# Patient Record
Sex: Male | Born: 1955 | Race: Black or African American | Hispanic: No | Marital: Single | Smoking: Current every day smoker
Health system: Southern US, Community
[De-identification: ages and names within clinical notes are randomized; demographics above are authoritative.]

## PROBLEM LIST (undated history)

## (undated) DIAGNOSIS — F419 Anxiety disorder, unspecified: Secondary | ICD-10-CM

## (undated) DIAGNOSIS — I219 Acute myocardial infarction, unspecified: Secondary | ICD-10-CM

## (undated) DIAGNOSIS — M1990 Unspecified osteoarthritis, unspecified site: Secondary | ICD-10-CM

## (undated) DIAGNOSIS — I10 Essential (primary) hypertension: Secondary | ICD-10-CM

## (undated) DIAGNOSIS — I639 Cerebral infarction, unspecified: Secondary | ICD-10-CM

## (undated) HISTORY — PX: CORONARY STENT PLACEMENT: SHX1402

---

## 2004-12-17 ENCOUNTER — Ambulatory Visit: Payer: Self-pay | Admitting: Family Medicine

## 2005-01-22 ENCOUNTER — Ambulatory Visit: Payer: Self-pay | Admitting: Family Medicine

## 2005-03-10 ENCOUNTER — Ambulatory Visit: Payer: Self-pay | Admitting: Nurse Practitioner

## 2005-03-11 ENCOUNTER — Ambulatory Visit: Payer: Self-pay | Admitting: *Deleted

## 2006-11-16 ENCOUNTER — Emergency Department (HOSPITAL_COMMUNITY): Admission: EM | Admit: 2006-11-16 | Discharge: 2006-11-16 | Payer: Self-pay | Admitting: Emergency Medicine

## 2007-02-18 ENCOUNTER — Ambulatory Visit: Payer: Self-pay | Admitting: Family Medicine

## 2007-03-11 ENCOUNTER — Ambulatory Visit: Payer: Self-pay | Admitting: Family Medicine

## 2007-03-11 LAB — CONVERTED CEMR LAB
AST: 21 units/L (ref 0–37)
Alkaline Phosphatase: 96 units/L (ref 39–117)
BUN: 11 mg/dL (ref 6–23)
Basophils Relative: 0 % (ref 0–1)
Calcium: 10.1 mg/dL (ref 8.4–10.5)
Creatinine, Ser: 0.86 mg/dL (ref 0.40–1.50)
Eosinophils Absolute: 0.1 10*3/uL (ref 0.0–0.7)
HDL: 31 mg/dL — ABNORMAL LOW (ref >39)
Hemoglobin: 15.3 g/dL (ref 13.0–17.0)
MCHC: 34 g/dL (ref 30.0–36.0)
MCV: 84.3 fL (ref 78.0–100.0)
Monocytes Absolute: 0.3 10*3/uL (ref 0.1–1.0)
Monocytes Relative: 6 % (ref 3–12)
RBC: 5.34 M/uL (ref 4.22–5.81)
Testosterone: 332.79 ng/dL — ABNORMAL LOW (ref 350–890)
Total CHOL/HDL Ratio: 7.6

## 2007-03-12 ENCOUNTER — Ambulatory Visit (HOSPITAL_COMMUNITY): Admission: RE | Admit: 2007-03-12 | Discharge: 2007-03-12 | Payer: Self-pay | Admitting: Family Medicine

## 2007-03-26 ENCOUNTER — Ambulatory Visit: Payer: Self-pay | Admitting: Family Medicine

## 2007-05-27 ENCOUNTER — Ambulatory Visit: Payer: Self-pay | Admitting: Internal Medicine

## 2007-05-28 ENCOUNTER — Ambulatory Visit (HOSPITAL_COMMUNITY): Admission: RE | Admit: 2007-05-28 | Discharge: 2007-05-28 | Payer: Self-pay | Admitting: Family Medicine

## 2007-11-12 ENCOUNTER — Ambulatory Visit: Payer: Self-pay | Admitting: Family Medicine

## 2007-11-12 LAB — CONVERTED CEMR LAB
BUN: 16 mg/dL (ref 6–23)
CO2: 22 meq/L (ref 19–32)
Cholesterol: 271 mg/dL — ABNORMAL HIGH (ref 0–200)
Glucose, Bld: 90 mg/dL (ref 70–99)
Potassium: 4.2 meq/L (ref 3.5–5.3)
VLDL: 43 mg/dL — ABNORMAL HIGH (ref 0–40)

## 2007-11-18 ENCOUNTER — Ambulatory Visit: Payer: Self-pay | Admitting: Internal Medicine

## 2007-12-29 ENCOUNTER — Ambulatory Visit: Payer: Self-pay | Admitting: Family Medicine

## 2008-02-25 ENCOUNTER — Ambulatory Visit: Payer: Self-pay | Admitting: Family Medicine

## 2008-02-25 LAB — CONVERTED CEMR LAB
Cholesterol: 222 mg/dL — ABNORMAL HIGH (ref 0–200)
HDL: 37 mg/dL — ABNORMAL LOW (ref >39)
LDL Cholesterol: 153 mg/dL — ABNORMAL HIGH (ref 0–99)
Triglycerides: 160 mg/dL — ABNORMAL HIGH (ref <150)

## 2008-05-23 ENCOUNTER — Ambulatory Visit: Payer: Self-pay | Admitting: Family Medicine

## 2008-09-15 ENCOUNTER — Emergency Department (HOSPITAL_COMMUNITY): Admission: EM | Admit: 2008-09-15 | Discharge: 2008-09-15 | Payer: Self-pay | Admitting: Emergency Medicine

## 2009-01-10 ENCOUNTER — Encounter (INDEPENDENT_AMBULATORY_CARE_PROVIDER_SITE_OTHER): Payer: Self-pay | Admitting: Family Medicine

## 2009-01-10 ENCOUNTER — Ambulatory Visit: Payer: Self-pay | Admitting: Internal Medicine

## 2009-01-10 LAB — CONVERTED CEMR LAB
Basophils Relative: 0 % (ref 0–1)
CO2: 19 meq/L (ref 19–32)
Calcium: 10.1 mg/dL (ref 8.4–10.5)
Chloride: 102 meq/L (ref 96–112)
Cholesterol: 283 mg/dL — ABNORMAL HIGH (ref 0–200)
Creatinine, Ser: 1.36 mg/dL (ref 0.40–1.50)
Eosinophils Absolute: 0.1 10*3/uL (ref 0.0–0.7)
Eosinophils Relative: 1 % (ref 0–5)
Glucose, Bld: 122 mg/dL — ABNORMAL HIGH (ref 70–99)
HCT: 45 % (ref 39.0–52.0)
Hemoglobin: 15.6 g/dL (ref 13.0–17.0)
MCHC: 34.7 g/dL (ref 30.0–36.0)
MCV: 87.9 fL (ref 78.0–100.0)
Monocytes Absolute: 0.6 10*3/uL (ref 0.1–1.0)
Monocytes Relative: 8 % (ref 3–12)
Neutrophils Relative %: 52 % (ref 43–77)
RBC: 5.12 M/uL (ref 4.22–5.81)
Total Bilirubin: 0.4 mg/dL (ref 0.3–1.2)
Total CHOL/HDL Ratio: 9.4
Triglycerides: 515 mg/dL — ABNORMAL HIGH (ref <150)

## 2010-02-19 ENCOUNTER — Other Ambulatory Visit (HOSPITAL_COMMUNITY): Payer: Self-pay | Admitting: "Endocrinology

## 2010-02-19 ENCOUNTER — Other Ambulatory Visit (HOSPITAL_COMMUNITY): Payer: Self-pay | Admitting: Internal Medicine

## 2010-02-19 DIAGNOSIS — M25569 Pain in unspecified knee: Secondary | ICD-10-CM

## 2010-02-19 DIAGNOSIS — G8929 Other chronic pain: Secondary | ICD-10-CM

## 2010-02-20 ENCOUNTER — Other Ambulatory Visit (HOSPITAL_COMMUNITY): Payer: Self-pay

## 2010-03-05 ENCOUNTER — Other Ambulatory Visit (HOSPITAL_COMMUNITY): Payer: Self-pay | Admitting: "Endocrinology

## 2010-03-05 ENCOUNTER — Ambulatory Visit (HOSPITAL_COMMUNITY)
Admission: RE | Admit: 2010-03-05 | Discharge: 2010-03-05 | Disposition: A | Discharge status: Home / Self Care | Payer: Medicaid Other | Source: Ambulatory Visit | Attending: Internal Medicine | Admitting: Internal Medicine

## 2010-03-05 DIAGNOSIS — IMO0002 Reserved for concepts with insufficient information to code with codable children: Secondary | ICD-10-CM | POA: Insufficient documentation

## 2010-03-05 DIAGNOSIS — M25569 Pain in unspecified knee: Secondary | ICD-10-CM | POA: Insufficient documentation

## 2010-03-05 DIAGNOSIS — M1710 Unilateral primary osteoarthritis, unspecified knee: Secondary | ICD-10-CM | POA: Insufficient documentation

## 2010-03-05 DIAGNOSIS — M25469 Effusion, unspecified knee: Secondary | ICD-10-CM | POA: Insufficient documentation

## 2010-07-18 ENCOUNTER — Telehealth: Payer: Self-pay

## 2010-09-03 NOTE — Telephone Encounter (Signed)
Called, customer not available. Will mail a letter for pt to call.

## 2010-09-25 ENCOUNTER — Telehealth: Payer: Self-pay

## 2010-09-25 NOTE — Telephone Encounter (Signed)
Called Dr. Letitia Neri office. Letter was returned from pt sent to [address] In [place]. Phone number always gives a recording, wireless customer not available on [phone]. They will make a note and get more info when he comes back in the office, and let us know.

## 2010-10-17 ENCOUNTER — Encounter: Payer: Self-pay | Admitting: *Deleted

## 2010-10-17 ENCOUNTER — Emergency Department (HOSPITAL_COMMUNITY)
Admission: EM | Admit: 2010-10-17 | Discharge: 2010-10-17 | Disposition: A | Discharge status: Home / Self Care | Payer: Medicare Other | Attending: Emergency Medicine | Admitting: Emergency Medicine

## 2010-10-17 DIAGNOSIS — M542 Cervicalgia: Secondary | ICD-10-CM | POA: Insufficient documentation

## 2010-10-17 DIAGNOSIS — M129 Arthropathy, unspecified: Secondary | ICD-10-CM | POA: Insufficient documentation

## 2010-10-17 DIAGNOSIS — M1990 Unspecified osteoarthritis, unspecified site: Secondary | ICD-10-CM

## 2010-10-17 DIAGNOSIS — F17200 Nicotine dependence, unspecified, uncomplicated: Secondary | ICD-10-CM | POA: Insufficient documentation

## 2010-10-17 HISTORY — DX: Essential (primary) hypertension: I10

## 2010-10-17 MED ORDER — TRAMADOL HCL 50 MG PO TABS: 50.0000 mg | ORAL_TABLET | Freq: Four times a day (QID) | ORAL | Status: AC | PRN

## 2010-10-17 MED ORDER — KETOROLAC TROMETHAMINE 60 MG/2ML IM SOLN
60.0000 mg | Freq: Once | INTRAMUSCULAR | Status: AC
  Administered 2010-10-17: 60 mg via INTRAMUSCULAR
  Filled 2010-10-17: qty 2

## 2010-10-17 NOTE — ED Provider Notes (Signed)
History     CSN: [id number] Arrival date & time: 10/17/2010  1:35 AM  Chief Complaint  Patient presents with  . 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 He states that this comes on intermittently, is moderate at this time, is worsened by changes in the weather and does not associated with fevers, vomiting, redness, swelling. He has taken no medications prior to arrival. He has been told that he needs to have both of his knees replaced.  Patient is a 55 y.o. male presenting with neck pain. The history is provided by the patient and medical records.  Neck Pain     Past Medical History  Diagnosis Date  . Hypertension   . Diabetes mellitus     History reviewed. No pertinent past surgical history.  History reviewed. No pertinent family history.  History  Substance Use Topics  . Smoking status: Current Everyday Smoker -- 0.5 packs/day    Types: Cigarettes  . Smokeless tobacco: Not on file  . Alcohol Use: Yes     occ. use      Review of Systems  Constitutional: Negative for fever and chills.  HENT: Positive for neck pain.   Respiratory: Negative for cough.   Gastrointestinal: Negative for nausea and vomiting.  Musculoskeletal: Negative for joint swelling.  Skin: Negative for rash.    Allergies  Review of patient's allergies indicates no known allergies.  Home Medications  No current outpatient prescriptions on file.  BP 139/101  Pulse 94  Temp(Src) 97.9 F (36.6 C) (Oral)  Resp 20  Ht 6\' 1"  (1.854 m)  Wt 250 lb (113.399 kg)  BMI 32.98 kg/m2  SpO2 99%  Physical Exam  Nursing note and vitals reviewed. Constitutional: He appears well-developed and well-nourished. No distress.  HENT:  Head:  Normocephalic and atraumatic.  Mouth/Throat: Oropharynx is clear and moist. No oropharyngeal exudate.  Eyes: Conjunctivae and EOM are normal. Pupils are equal, round, and reactive to light. Right eye exhibits no discharge. Left eye exhibits no discharge. No scleral icterus.  Neck: No JVD present. No thyromegaly present.       Patient is able to move his head through a full range of motion but has some pain with rotation.  Cardiovascular: Normal rate, regular rhythm, normal heart sounds and intact distal pulses.  Exam reveals no gallop and no friction rub.   No murmur heard. Pulmonary/Chest: Effort normal and breath sounds normal. No respiratory distress. He has no wheezes. He has no rales.  Abdominal: Soft. Bowel sounds are normal. He exhibits no distension and no mass. There is no tenderness.  Musculoskeletal: He exhibits tenderness. He exhibits no edema.       Bilateral knees with crepitance with range of motion. No clinical effusions present. No other joint arthropathies  Lymphadenopathy:    He has no cervical adenopathy.  Neurological: He is alert. Coordination normal.  Skin: Skin is warm and dry. No rash noted. No erythema.  Psychiatric: He has a normal mood and affect. His behavior is normal.    ED Course  Procedures (including critical care time)  Labs Reviewed - No data to display No results found.   No diagnosis found.    MDM  Chronic arthritis pain present on exam. We'll start on tramadol for home. Toradol given in  the emergency department. Patient states that he has been on oxycodone 10 mg tabs in the past for relief of the symptoms. I have referred him back to his family Dr. for this medicine.        Vida Roller, MD 10/17/10 [phone]

## 2010-10-17 NOTE — ED Notes (Signed)
C/o sharp pains to neck and to feet off and on

## 2010-10-17 NOTE — ED Notes (Signed)
Pt left er stating no needs 

## 2010-10-22 ENCOUNTER — Emergency Department (HOSPITAL_COMMUNITY)
Admission: EM | Admit: 2010-10-22 | Discharge: 2010-10-22 | Disposition: A | Discharge status: Other Acute Inpatient Hospital | Payer: Medicare Other | Source: Home / Self Care | Attending: Emergency Medicine | Admitting: Emergency Medicine

## 2010-10-22 ENCOUNTER — Encounter (HOSPITAL_COMMUNITY): Payer: Self-pay | Admitting: *Deleted

## 2010-10-22 ENCOUNTER — Emergency Department (HOSPITAL_COMMUNITY): Payer: Medicare Other

## 2010-10-22 ENCOUNTER — Inpatient Hospital Stay (HOSPITAL_COMMUNITY)
Admission: AD | Admit: 2010-10-22 | Discharge: 2010-10-30 | DRG: 246 | Disposition: A | Discharge status: Home / Self Care | Payer: Medicare Other | Source: Other Acute Inpatient Hospital | Attending: Internal Medicine | Admitting: Internal Medicine

## 2010-10-22 DIAGNOSIS — R079 Chest pain, unspecified: Secondary | ICD-10-CM | POA: Insufficient documentation

## 2010-10-22 DIAGNOSIS — M069 Rheumatoid arthritis, unspecified: Secondary | ICD-10-CM | POA: Diagnosis present

## 2010-10-22 DIAGNOSIS — Z7901 Long term (current) use of anticoagulants: Secondary | ICD-10-CM

## 2010-10-22 DIAGNOSIS — R55 Syncope and collapse: Secondary | ICD-10-CM

## 2010-10-22 DIAGNOSIS — F17200 Nicotine dependence, unspecified, uncomplicated: Secondary | ICD-10-CM | POA: Diagnosis present

## 2010-10-22 DIAGNOSIS — I200 Unstable angina: Secondary | ICD-10-CM | POA: Diagnosis present

## 2010-10-22 DIAGNOSIS — I10 Essential (primary) hypertension: Secondary | ICD-10-CM | POA: Diagnosis present

## 2010-10-22 DIAGNOSIS — E119 Type 2 diabetes mellitus without complications: Secondary | ICD-10-CM | POA: Diagnosis present

## 2010-10-22 DIAGNOSIS — M1990 Unspecified osteoarthritis, unspecified site: Secondary | ICD-10-CM | POA: Diagnosis present

## 2010-10-22 DIAGNOSIS — I824Z9 Acute embolism and thrombosis of unspecified deep veins of unspecified distal lower extremity: Secondary | ICD-10-CM | POA: Diagnosis not present

## 2010-10-22 DIAGNOSIS — I2510 Atherosclerotic heart disease of native coronary artery without angina pectoris: Principal | ICD-10-CM | POA: Diagnosis present

## 2010-10-22 DIAGNOSIS — E785 Hyperlipidemia, unspecified: Secondary | ICD-10-CM | POA: Diagnosis present

## 2010-10-22 DIAGNOSIS — R0602 Shortness of breath: Secondary | ICD-10-CM | POA: Insufficient documentation

## 2010-10-22 DIAGNOSIS — I2699 Other pulmonary embolism without acute cor pulmonale: Secondary | ICD-10-CM | POA: Diagnosis not present

## 2010-10-22 HISTORY — DX: Unspecified osteoarthritis, unspecified site: M19.90

## 2010-10-22 HISTORY — DX: Anxiety disorder, unspecified: F41.9

## 2010-10-22 LAB — BASIC METABOLIC PANEL
BUN: 9 mg/dL (ref 6–23)
CO2: 28 mEq/L (ref 19–32)
Calcium: 9.4 mg/dL (ref 8.4–10.5)
GFR calc non Af Amer: 70 mL/min — ABNORMAL LOW (ref >90)
Glucose, Bld: 131 mg/dL — ABNORMAL HIGH (ref 70–99)
Potassium: 3.9 mEq/L (ref 3.5–5.1)

## 2010-10-22 LAB — CARDIAC PANEL(CRET KIN+CKTOT+MB+TROPI)
CK, MB: 4.9 ng/mL — ABNORMAL HIGH (ref 0.3–4.0)
CK, MB: 4.9 ng/mL — ABNORMAL HIGH (ref 0.3–4.0)
CK, MB: 5 ng/mL — ABNORMAL HIGH (ref 0.3–4.0)
Relative Index: 1.1 (ref 0.0–2.5)
Total CK: 463 U/L — ABNORMAL HIGH (ref 7–232)
Troponin I: 0.48 ng/mL (ref <0.30)
Troponin I: 0.49 ng/mL (ref <0.30)

## 2010-10-22 LAB — DIFFERENTIAL
Basophils Relative: 0 % (ref 0–1)
Eosinophils Absolute: 0 10*3/uL (ref 0.0–0.7)
Eosinophils Relative: 0 % (ref 0–5)
Lymphs Abs: 1.1 10*3/uL (ref 0.7–4.0)
Monocytes Relative: 7 % (ref 3–12)
Neutrophils Relative %: 76 % (ref 43–77)

## 2010-10-22 LAB — CBC
Hemoglobin: 15.7 g/dL (ref 13.0–17.0)
MCH: 30.8 pg (ref 26.0–34.0)
MCHC: 35.1 g/dL (ref 30.0–36.0)
MCV: 87.8 fL (ref 78.0–100.0)
RBC: 5.09 MIL/uL (ref 4.22–5.81)

## 2010-10-22 LAB — GLUCOSE, CAPILLARY
Glucose-Capillary: 73 mg/dL (ref 70–99)
Glucose-Capillary: 85 mg/dL (ref 70–99)

## 2010-10-22 MED ORDER — HEPARIN (PORCINE) IN NACL 100-0.45 UNIT/ML-% IJ SOLN
1000.0000 [IU]/h | Freq: Once | INTRAMUSCULAR | Status: AC
  Administered 2010-10-22: 1000 [IU]/h via INTRAVENOUS
  Filled 2010-10-22: qty 250

## 2010-10-22 MED ORDER — ASPIRIN 81 MG PO CHEW
324.0000 mg | CHEWABLE_TABLET | Freq: Once | ORAL | Status: AC
  Administered 2010-10-22: 324 mg via ORAL
  Filled 2010-10-22: qty 4

## 2010-10-22 MED ORDER — SODIUM CHLORIDE 0.9 % IV SOLN
Freq: Once | INTRAVENOUS | Status: AC
  Administered 2010-10-22: 10:00:00 via INTRAVENOUS

## 2010-10-22 NOTE — ED Notes (Signed)
Pt states that he was walking down the street and became very dizzy and sweaty and felt like he was going to pass out. Also c/o pain in his left leg since this morning. Denies nausea, vomiting, diarrhea or shortness of breath.

## 2010-10-22 NOTE — ED Provider Notes (Signed)
History     CSN: [id number] Arrival date & time: 10/22/2010  9:23 AM  Chief Complaint  Patient presents with  . Near Syncope    (Consider location/radiation/quality/duration/timing/severity/associated sxs/prior treatment) HPI Comments: Pt states he was walking [place] the [place] ~ 2 blocks from home.  He suddenly became diaphoretic and "dizzy-headed" and thought he was going to pass out.  He became mildly SOB.  He had several episodes of "sharp" L anterior CP with each lasting a second or two.  No radiation.  No nausea or vomiting.   He is a pt of dr. Felecia Shelling.  He denies any know CAD.  No prior caths or treadmills.  Feeling nearly normal again at exam time.  The history is provided by the patient. No language interpreter was used.    Past Medical History  Diagnosis Date  . Hypertension   . Diabetes mellitus   . Anxiety   . Arthritis     History reviewed. No pertinent past surgical history.  History reviewed. No pertinent family history.  History  Substance Use Topics  . Smoking status: Current Everyday Smoker -- 0.5 packs/day    Types: Cigarettes  . Smokeless tobacco: Not on file  . Alcohol Use: Yes     occ. use      Review of Systems  Constitutional: Positive for diaphoresis.  Respiratory: Positive for shortness of breath.   Cardiovascular: Positive for chest pain. Negative for palpitations and leg swelling.  Gastrointestinal: Negative for nausea and vomiting.  All other systems reviewed and are negative.    Allergies  Review of patient's allergies indicates no known allergies.  Home Medications   Current Outpatient Rx  Name Route Sig Dispense Refill  . TRAMADOL HCL 50 MG PO TABS Oral Take 1 tablet (50 mg total) by mouth every 6 (six) hours as needed for pain. 15 tablet 0    BP 147/104  Pulse 96  Temp(Src) 97.6 F (36.4 C) (Oral)  Resp 22  Ht 6\' 1"  (1.854 m)  Wt 250 lb (113.399 kg)  BMI 32.98 kg/m2  SpO2 94%  Physical Exam  Nursing note and  vitals reviewed. Constitutional: He is oriented to person, place, and time. Vital signs are normal. He appears well-developed and well-nourished. No distress.  HENT:  Head: Normocephalic and atraumatic.  Right Ear: External ear normal.  Left Ear: External ear normal.  Nose: Nose normal.  Mouth/Throat: No oropharyngeal exudate.  Eyes: Conjunctivae and EOM are normal. Pupils are equal, round, and reactive to light. Right eye exhibits no discharge. Left eye exhibits no discharge. No scleral icterus.  Neck: Normal range of motion and phonation normal. Neck supple. Normal carotid pulses and no JVD present. Carotid bruit is not present. No tracheal deviation present. No thyromegaly present.  Cardiovascular: Normal rate, regular rhythm, S1 normal, S2 normal, normal heart sounds, intact distal pulses and normal pulses.  PMI is not displaced.  Exam reveals no gallop, no distant heart sounds and no friction rub.   No murmur heard. Pulmonary/Chest: Effort normal and breath sounds normal. No stridor. No respiratory distress. He has no wheezes. He has no rales. He exhibits no tenderness.  Abdominal: Soft. Normal appearance and bowel sounds are normal. He exhibits no distension and no mass. There is no tenderness. There is no rebound and no guarding.  Musculoskeletal: Normal range of motion. He exhibits tenderness. He exhibits no edema.       L chest pain reproducible with palpation.  Lymphadenopathy:    He has  no cervical adenopathy.  Neurological: He is alert and oriented to person, place, and time. He has normal reflexes. Coordination normal. GCS eye subscore is 4. GCS verbal subscore is 5. GCS motor subscore is 6.  Skin: Skin is warm and dry. No rash noted. He is not diaphoretic.  Psychiatric: He has a normal mood and affect. His speech is normal and behavior is normal. Judgment and thought content normal. Cognition and memory are normal.    ED Course  Procedures (including critical care  time)   Labs Reviewed  BASIC METABOLIC PANEL  CBC  DIFFERENTIAL  CARDIAC PANEL(CRET KIN+CKTOT+MB+TROPI)   Dg Chest Portable 1 View  10/22/2010  *RADIOLOGY REPORT*  Clinical Data: Left chest pain  PORTABLE CHEST - 1 VIEW  Comparison: 03/12/2007  Findings: Heart size and vascularity are normal.  Negative for pneumonia or effusion.  Eventration left hemidiaphragm unchanged. Negative for mass lesion.  IMPRESSION: No active cardiopulmonary disease.  Original Report Authenticated By: Camelia Phenes, M.D.     No diagnosis found.   Date: 10/22/2010  Rate:95  Rhythm: normal sinus rhythm  QRS Axis: normal  Intervals: normal  ST/T Wave abnormalities: normal  Conduction Disutrbances:none  Narrative Interpretation:   Old EKG Reviewed: none available    MDM     Medical screening examination/treatment/procedure(s) were conducted as a shared visit with non-physician practitioner(s) and myself.  I personally evaluated the patient during the encounter     Worthy Rancher, [place] 10/22/10 1031  Suzi Roots, MD 10/22/10 1058

## 2010-10-22 NOTE — ED Notes (Signed)
Resting in bed with nad noted. Pt denies any pain at this time. cb at side. Will continue to monitor.

## 2010-10-22 NOTE — ED Notes (Signed)
Cardiology paged through [facility] at this time to rick at [phone].  Misty Stanley

## 2010-10-22 NOTE — ED Notes (Signed)
nad noted prior to transfer to [facility]00 room 36.

## 2010-10-22 NOTE — ED Notes (Signed)
Dr. Felecia Shelling paged for [name] Walker.

## 2010-10-22 NOTE — ED Notes (Signed)
CRITICAL VALUE ALERT  Critical value received: Trop 0.483  Date of notification: 10/22/10  Time of notification:  1335  Critical value read back:yes  Nurse who received alert:  Lilia Pro RN  MD notified (1st page):Rick Hyacinth Meeker PA  Time of first page: [phone]  MD notified (2nd page):  Time of second page:  Responding MD: Al Decant PA  Time MD responded:  [phone]

## 2010-10-22 NOTE — ED Provider Notes (Signed)
History     CSN: [id number] Arrival date & time: 10/22/2010  9:23 AM  Chief Complaint  Patient presents with  . Near Syncope    (Consider location/radiation/quality/duration/timing/severity/associated sxs/prior treatment) HPI  Past Medical History  Diagnosis Date  . Hypertension   . Diabetes mellitus   . Anxiety   . Arthritis     History reviewed. No pertinent past surgical history.  History reviewed. No pertinent family history.  History  Substance Use Topics  . Smoking status: Current Everyday Smoker -- 0.5 packs/day    Types: Cigarettes  . Smokeless tobacco: Not on file  . Alcohol Use: Yes     occ. use      Review of Systems  Allergies  Review of patient's allergies indicates no known allergies.  Home Medications   Current Outpatient Rx  Name Route Sig Dispense Refill  . TRAMADOL HCL 50 MG PO TABS Oral Take 1 tablet (50 mg total) by mouth every 6 (six) hours as needed for pain. 15 tablet 0    BP 128/91  Pulse 93  Temp(Src) 97.6 F (36.4 C) (Oral)  Resp 20  Ht 6\' 1"  (1.854 m)  Wt 250 lb (113.399 kg)  BMI 32.98 kg/m2  SpO2 97%  Physical Exam  ED Course  Procedures (including critical care time)  Labs Reviewed  BASIC METABOLIC PANEL - Abnormal; Notable for the following:    Glucose, Bld 131 (*)    GFR calc non Af Amer 70 (*)    GFR calc Af Amer 82 (*)    All other components within normal limits  CARDIAC PANEL(CRET KIN+CKTOT+MB+TROPI) - Abnormal; Notable for the following:    Total CK 501 (*)    CK, MB 4.9 (*)    All other components within normal limits  CARDIAC PANEL(CRET KIN+CKTOT+MB+TROPI) - Abnormal; Notable for the following:    Total CK 463 (*)    CK, MB 4.9 (*)    Troponin I 0.48 (*)    All other components within normal limits  CBC  DIFFERENTIAL  GLUCOSE, CAPILLARY  POCT CBG MONITORING   Dg Chest Portable 1 View  10/22/2010  *RADIOLOGY REPORT*  Clinical Data: Left chest pain  PORTABLE CHEST - 1 VIEW  Comparison: 03/12/2007   Findings: Heart size and vascularity are normal.  Negative for pneumonia or effusion.  Eventration left hemidiaphragm unchanged. Negative for mass lesion.  IMPRESSION: No active cardiopulmonary disease.  Original Report Authenticated By: Camelia Phenes, M.D.     No diagnosis found.    MDM  2:10 PM Spoke with dr. Rennis Golden with [facility] via [facility].  He has accepted  The pt and he will be transferred to [facility] cone, unit 2000, telemetry.    Medical screening examination/treatment/procedure(s) were conducted as a shared visit with non-physician practitioner(s) and myself.  I personally evaluated the patient during the encounter    Worthy Rancher, [place] 10/22/10 1412  Suzi Roots, MD 10/22/10 1434

## 2010-10-22 NOTE — ED Notes (Signed)
[facility] here at this time for transport 

## 2010-10-22 NOTE — ED Notes (Signed)
Pt showing NSR on CCM. Resting with no complaints at present.

## 2010-10-22 NOTE — ED Notes (Signed)
New order received from [facility] PA for Heparin Bolus 5000units, then 1000units/hr.

## 2010-10-22 NOTE — ED Provider Notes (Signed)
Pt c/o feeling faint/lightheaded while walking. Had 1-2 seconds of chart cp. No other current or recent cp. No sob. Denies palpitations or irreg hr. No hx syncope of dysrhythmia. Denies blood loss or melena. No abd pain no nvd. No headache. No fever or chills. Chest cta. Rrr. abd soft nt.   Suzi Roots, MD 10/22/10 1056

## 2010-10-22 NOTE — ED Notes (Signed)
Per Al Decant PA pt able to eat.

## 2010-10-23 LAB — LIPID PANEL
Cholesterol: 178 mg/dL (ref 0–200)
HDL: 29 mg/dL — ABNORMAL LOW (ref >39)
Total CHOL/HDL Ratio: 6.1 RATIO
Triglycerides: 134 mg/dL (ref <150)

## 2010-10-23 LAB — POCT ACTIVATED CLOTTING TIME: Activated Clotting Time: 446 seconds

## 2010-10-23 LAB — BASIC METABOLIC PANEL
Chloride: 106 mEq/L (ref 96–112)
GFR calc Af Amer: 81 mL/min — ABNORMAL LOW (ref >90)
GFR calc non Af Amer: 70 mL/min — ABNORMAL LOW (ref >90)
Potassium: 3.8 mEq/L (ref 3.5–5.1)
Sodium: 140 mEq/L (ref 135–145)

## 2010-10-23 LAB — CARDIAC PANEL(CRET KIN+CKTOT+MB+TROPI)
Relative Index: 1.2 (ref 0.0–2.5)
Total CK: 370 U/L — ABNORMAL HIGH (ref 7–232)
Troponin I: <0.3 ng/mL (ref <0.30)

## 2010-10-23 LAB — GLUCOSE, CAPILLARY
Glucose-Capillary: 91 mg/dL (ref 70–99)
Glucose-Capillary: 92 mg/dL (ref 70–99)
Glucose-Capillary: 97 mg/dL (ref 70–99)
Glucose-Capillary: 86 mg/dL (ref 70–99)
Glucose-Capillary: 103 mg/dL — ABNORMAL HIGH (ref 70–99)

## 2010-10-23 LAB — DIFFERENTIAL
Basophils Absolute: 0 10*3/uL (ref 0.0–0.1)
Eosinophils Absolute: 0.1 10*3/uL (ref 0.0–0.7)
Eosinophils Relative: 1 % (ref 0–5)
Lymphocytes Relative: 36 % (ref 12–46)
Lymphs Abs: 2.2 10*3/uL (ref 0.7–4.0)
Neutrophils Relative %: 54 % (ref 43–77)

## 2010-10-23 LAB — CBC
Platelets: 150 10*3/uL (ref 150–400)
RBC: 4.52 MIL/uL (ref 4.22–5.81)
RDW: 13.7 % (ref 11.5–15.5)
WBC: 6.3 10*3/uL (ref 4.0–10.5)

## 2010-10-23 LAB — HEPARIN LEVEL (UNFRACTIONATED): Heparin Unfractionated: 0.1 IU/mL — ABNORMAL LOW (ref 0.30–0.70)

## 2010-10-23 LAB — HEMOGLOBIN A1C
Hgb A1c MFr Bld: 6.4 % — ABNORMAL HIGH (ref <5.7)
Mean Plasma Glucose: 137 mg/dL — ABNORMAL HIGH (ref <117)

## 2010-10-23 LAB — PROTIME-INR: Prothrombin Time: 14.2 seconds (ref 11.6–15.2)

## 2010-10-23 LAB — TSH: TSH: 0.9 u[IU]/mL (ref 0.350–4.500)

## 2010-10-24 ENCOUNTER — Inpatient Hospital Stay (HOSPITAL_COMMUNITY): Payer: Medicare Other

## 2010-10-24 DIAGNOSIS — M79609 Pain in unspecified limb: Secondary | ICD-10-CM

## 2010-10-24 LAB — CBC
HCT: 38 % — ABNORMAL LOW (ref 39.0–52.0)
RDW: 13.7 % (ref 11.5–15.5)
WBC: 6.4 10*3/uL (ref 4.0–10.5)

## 2010-10-24 LAB — BASIC METABOLIC PANEL
BUN: 7 mg/dL (ref 6–23)
Chloride: 108 mEq/L (ref 96–112)
GFR calc Af Amer: 77 mL/min — ABNORMAL LOW (ref >90)
Potassium: 4 mEq/L (ref 3.5–5.1)
Sodium: 140 mEq/L (ref 135–145)

## 2010-10-24 LAB — DIFFERENTIAL
Basophils Absolute: 0 10*3/uL (ref 0.0–0.1)
Eosinophils Relative: 2 % (ref 0–5)
Lymphocytes Relative: 28 % (ref 12–46)
Neutro Abs: 4 10*3/uL (ref 1.7–7.7)

## 2010-10-24 LAB — GLUCOSE, CAPILLARY: Glucose-Capillary: 159 mg/dL — ABNORMAL HIGH (ref 70–99)

## 2010-10-24 LAB — HEPARIN LEVEL (UNFRACTIONATED): Heparin Unfractionated: <0.1 IU/mL — ABNORMAL LOW (ref 0.30–0.70)

## 2010-10-24 MED ORDER — IOHEXOL 300 MG/ML  SOLN
100.0000 mL | Freq: Once | INTRAMUSCULAR | Status: AC | PRN
  Administered 2010-10-24: 100 mL via INTRAVENOUS

## 2010-10-25 LAB — GLUCOSE, CAPILLARY
Glucose-Capillary: 103 mg/dL — ABNORMAL HIGH (ref 70–99)
Glucose-Capillary: 113 mg/dL — ABNORMAL HIGH (ref 70–99)

## 2010-10-25 LAB — CBC
HCT: 41.3 % (ref 39.0–52.0)
Hemoglobin: 14.3 g/dL (ref 13.0–17.0)
MCH: 29.7 pg (ref 26.0–34.0)
MCHC: 34.6 g/dL (ref 30.0–36.0)
MCV: 85.7 fL (ref 78.0–100.0)
Platelets: 178 10*3/uL (ref 150–400)
RBC: 4.82 MIL/uL (ref 4.22–5.81)
RDW: 13.7 % (ref 11.5–15.5)
WBC: 5.7 10*3/uL (ref 4.0–10.5)

## 2010-10-25 LAB — BASIC METABOLIC PANEL
GFR calc Af Amer: >90 mL/min (ref >90)
GFR calc non Af Amer: 79 mL/min — ABNORMAL LOW (ref >90)
Potassium: 3.8 mEq/L (ref 3.5–5.1)
Sodium: 139 mEq/L (ref 135–145)

## 2010-10-25 LAB — PROTIME-INR
INR: 1.09 (ref 0.00–1.49)
Prothrombin Time: 14.3 seconds (ref 11.6–15.2)

## 2010-10-25 LAB — HEMOGLOBIN A1C: Hgb A1c MFr Bld: 6.4 % — ABNORMAL HIGH (ref <5.7)

## 2010-10-26 LAB — GLUCOSE, CAPILLARY: Glucose-Capillary: 94 mg/dL (ref 70–99)

## 2010-10-26 LAB — PSA: PSA: 0.38 ng/mL (ref <=4.00)

## 2010-10-26 LAB — PROTIME-INR
INR: 1.13 (ref 0.00–1.49)
Prothrombin Time: 14.7 seconds (ref 11.6–15.2)

## 2010-10-27 LAB — CBC
Hemoglobin: 15.5 g/dL (ref 13.0–17.0)
MCH: 30.7 pg (ref 26.0–34.0)
MCV: 87.7 fL (ref 78.0–100.0)
RBC: 5.05 MIL/uL (ref 4.22–5.81)
WBC: 5.9 10*3/uL (ref 4.0–10.5)

## 2010-10-27 LAB — GLUCOSE, CAPILLARY
Glucose-Capillary: 106 mg/dL — ABNORMAL HIGH (ref 70–99)
Glucose-Capillary: 105 mg/dL — ABNORMAL HIGH (ref 70–99)

## 2010-10-27 LAB — BASIC METABOLIC PANEL
CO2: 29 mEq/L (ref 19–32)
Calcium: 9.8 mg/dL (ref 8.4–10.5)
Glucose, Bld: 98 mg/dL (ref 70–99)
Sodium: 141 mEq/L (ref 135–145)

## 2010-10-27 LAB — PROTIME-INR: Prothrombin Time: 17.1 seconds — ABNORMAL HIGH (ref 11.6–15.2)

## 2010-10-28 LAB — BASIC METABOLIC PANEL
BUN: 12 mg/dL (ref 6–23)
Chloride: 101 mEq/L (ref 96–112)
Creatinine, Ser: 1.21 mg/dL (ref 0.50–1.35)
Glucose, Bld: 95 mg/dL (ref 70–99)
Potassium: 4.1 mEq/L (ref 3.5–5.1)

## 2010-10-28 LAB — GLUCOSE, CAPILLARY
Glucose-Capillary: 79 mg/dL (ref 70–99)
Glucose-Capillary: 107 mg/dL — ABNORMAL HIGH (ref 70–99)

## 2010-10-28 LAB — CBC
HCT: 43.2 % (ref 39.0–52.0)
MCH: 30.1 pg (ref 26.0–34.0)
MCHC: 34.7 g/dL (ref 30.0–36.0)
MCV: 86.6 fL (ref 78.0–100.0)
WBC: 5.3 10*3/uL (ref 4.0–10.5)

## 2010-10-28 NOTE — Cardiovascular Report (Signed)
NAMEEFFREY, [name] Walker NO.:  [id number]  MEDICAL RECORD NO.:  [id number]  LOCATION:  [id number]                         FACILITY:  [facility]  PHYSICIAN:  Nicki Guadalajara, M.D.     DATE OF BIRTH:  1955/06/30  DATE OF PROCEDURE:  10/23/2010 DATE OF DISCHARGE:  10/22/2010                           CARDIAC CATHETERIZATION   PROCEDURE:  Cardiac catheterization and percutaneous coronary intervention.  SURGEON:  Nicki Guadalajara, MD  INDICATIONS:  [name] Walker is a 55 year old African American gentleman who was admitted to [facility] yesterday with chest pain suggestive of unstable angina.  The patient has a history of tobacco use, type 2 diabetes mellitus, and hypertension.  Troponin was mildly positive at 0.48.  The patient was treated with unstable angina. Definitive cardiac catheterization is now recommended.  PROCEDURE IN DETAIL:  After premedication with Versed 2 mg plus fentanyl 50 mcg, the patient was prepped and draped in usual fashion.  His right femoral artery was punctured anteriorly and a 5-French sheath was inserted without difficulty.  A 5-French FL-4 left and right diagnostic catheter was used for selective angiography of the coronary arteries.  A 5-French pigtail catheter was used for left ventriculography.  With the patient's hypertensive history, distal aortography was also performed.  With the demonstration of high-grade segmental disease in the circumflex marginal branch of the circumflex, the angiographic findings were reviewed with the patient in detail.  Options were discussed.  The decision was made to pursue with coronary intervention.  The 5-French arterial sheath was then upgraded to a 6-French system.  Angiomax bolus plus infusion was administered.  With the patient being diabetic, he received 60 mg of Effient for antiplatelet therapy.  A 6-French XB-4 guide was used for the intervention.  IC nitroglycerin was administered down the  left coronary system without demonstrable change in the high- grade segmental circumflex marginal stenosis.  A Prowater wire was then advanced into the obtuse marginal vessel.  A 2.5 x 20 mm TREK balloon was used for predilatation and also to allow for optimal sizing of this lesion length.  Several inflations were made with this balloon.  A 2.5 x 28 mm DES Promus Element stent was then inserted which was necessary to cover the entire segmental region.  This was dilated x2 up to 13 atmospheres.  A 2.75 x 20 mm Emperor Needles balloon was used for post-stent dilatation up to approximately 2.68 mm.  Scout angiography confirmed an excellent angiographic result.  There was brisk TIMI-3 flow without evidence for dissection.  The patient was continued on Angiomax and will be continued for several hours following the procedure.  Arterial sheath was sutured in place with plans for sheath removal later today.  HEMODYNAMIC DATA:  Central aortic pressure was 105/75.  Left ventricular pressure 105/13.  ANGIOGRAPHIC DATA:  Left main coronary artery was angiographically normal and bifurcated into an LAD and left circumflex system.  The LAD gave rise to a proximal moderate-sized first diagonal vessel. There was 50% ostial narrowing in this diagonal vessel.  There was 50- 60% mid-distal focal narrowing in this diagonal vessel.  The LAD after the diagonal takeoff had irregularity with narrowing of 30-40%.  The LAD extended to and  wrapped around the LV apex.  The circumflex vessel was moderate-sized vessel that gave rise to one small first marginal vessel.  The second major marginal vessel had diffuse segmental high-grade stenosis of 95% proximally followed by 50% and then 90-95% before giving rise to a distal branch.  The AV groove circumflex after the second diagonal takeoff had 20-30% mid narrowing.  The right coronary artery had a shepherd's crook takeoff.  There was mild 20% proximal narrowing.  A  small anterior RV marginal branch had 70% ostial stenosis.  The RCA supplied the PDA.  RAO ventriculography revealed normal LV contractility with an ejection fraction of 55%.  However, there was a very small focal area of subtle mid inferior hypocontractility concordant with the patient's circumflex high-grade circumflex stenosis.  Distal aortography demonstrated widely patent renal arteries without significant aortoiliac disease.  Following percutaneous coronary intervention to the circumflex marginal vessel with predilatation with the 2.5 x 20 mm TREK balloon, stenting with a 2.5 x 28 mm Promus Element DES stent postdilated to approximately 2.68 mm, the entire region of 95%, 50%, and 90-95% stenosis was reduced to 0%.  There was brisk TIMI-3 flow without evidence for dissection.  IMPRESSION: 1. Preserved global left ventricular function with smaller focal area     of mid inferior hypocontractility. 2. Multivessel coronary artery disease with 50% narrowing in the     ostial of the first diagonal vessel with 50-60% mid distal diagonal     stenosis and 30-40% proximal left anterior descending stenosis;     segmental 95%, 50%, and 90-95% stenosis in the obtuse marginal 2     vessel of the left circumflex coronary artery; and 20% mild     narrowing in the proximal right coronary artery with 70% ostial     narrowing in an anterior left ventricular marginal branch. 3. Successful percutaneous coronary intervention to the circumflex     marginal vessel with ultimate insertion of a 2.5 x 28 mm Promus     Element DES stent postdilated to 2.68 mm with the segmental 95%,     50%, and 90-95% stenosis being reduced to 0%. 4. Angiomax/Effient 60 mg/intracoronary nitroglycerin.          ______________________________ Nicki Guadalajara, M.D.     TK/MEDQ  D:  10/23/2010  T:  10/23/2010  Job:  [id number]  Electronically Signed by Nicki Guadalajara M.D. on 10/28/2010 04:11:01 PM
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
## 2010-10-29 LAB — GLUCOSE, CAPILLARY
Glucose-Capillary: 105 mg/dL — ABNORMAL HIGH (ref 70–99)
Glucose-Capillary: 118 mg/dL — ABNORMAL HIGH (ref 70–99)

## 2010-10-29 LAB — PROTIME-INR
INR: 1.95 — ABNORMAL HIGH (ref 0.00–1.49)
Prothrombin Time: 22.6 seconds — ABNORMAL HIGH (ref 11.6–15.2)

## 2010-10-30 LAB — BASIC METABOLIC PANEL
Calcium: 9.5 mg/dL (ref 8.4–10.5)
GFR calc Af Amer: 65 mL/min — ABNORMAL LOW (ref >90)
GFR calc non Af Amer: 56 mL/min — ABNORMAL LOW (ref >90)
Glucose, Bld: 90 mg/dL (ref 70–99)
Sodium: 137 mEq/L (ref 135–145)

## 2010-10-30 LAB — PROTIME-INR
INR: 2.11 — ABNORMAL HIGH (ref 0.00–1.49)
Prothrombin Time: 24 seconds — ABNORMAL HIGH (ref 11.6–15.2)

## 2010-10-30 LAB — GLUCOSE, CAPILLARY
Glucose-Capillary: 96 mg/dL (ref 70–99)
Glucose-Capillary: 91 mg/dL (ref 70–99)

## 2010-10-30 LAB — OCCULT BLOOD X 1 CARD TO LAB, STOOL: Fecal Occult Bld: POSITIVE

## 2010-11-08 NOTE — Discharge Summary (Signed)
[name], [name]          ACCOUNT NO.:  [id number]  MEDICAL RECORD NO.:  [id number]  LOCATION:  2035                         FACILITY:  [facility]  PHYSICIAN:  [place] Hilty, MD         DATE OF BIRTH:  02-11-1955  DATE OF ADMISSION:  10/22/2010 DATE OF DISCHARGE:  10/30/2010                              DISCHARGE SUMMARY   DISCHARGE DIAGNOSES: 1. Unstable angina with peak troponin 0.49 negative relative index. 2. Coronary artery disease with placement of drug-eluting stent to the     obtuse marginal October 23, 2010, secondary to 95% stenosis with     ejection fraction at catheterization 55%.     a.     Residual coronary artery disease with 50-60% stenosis in the      diagonal, 30-40 in the left anterior descending. 3. Bilateral pulmonary emboli.     a.     Anticoagulation with Coumadin, Lovenox crossover x7 days.      INR at discharge 2.1. 4. Deep vein thrombosis, left tibioperoneal trunk and posterior calf. 5. Diabetes mellitus type 2, stable though hypoglycemic during     hospitalization, have been holding glipizide. 6. Dyslipidemia. 7. Tobacco use. 8. Anticoagulation as stated. 9. History of rheumatoid arthritis. 10.Grade 1 diastolic dysfunction with ejection fraction of 60% on 2D     echo.  DISCHARGE CONDITION:  Improved and good.  PROCEDURES:  Combined left heart cath by Dr. Aleen [name] October 23, 2010.  PTCA and stent deployment to the OM with a drug-eluting PROMUS stent by Dr. Nicki [name] October 23, 2010.  HOSPITAL COURSE:  A 55 year old African American male presented to the emergency room at [facility] on October 22, 2010, after walking up road on the morning of admission with sudden onset of sharp chest pain, left-sided, 8/10 in intensity, associated with diaphoresis, excessive running nose, dizziness and shortness of breath.  The patient stated the chest pain would come and go repeatedly in rapid manner.  He also experienced a muscle spasm in his left calf  and some blurry vision.  He denied nausea, vomiting or headache.  He was admitted to rule out MI, rule out PE.  D- dimer was done that was elevated, so he underwent CT angio of the chest which revealed bilateral pulmonary emboli and he had venous Dopplers which revealed DVT of the left tibioperoneal trunk and a branch that travels down the posterior calf.  He was started on Lovenox and please note that CT angio of the chest was done the day after his cardiac catheterization.  He was started on Lovenox once his heparin and Angiomax were discontinued.  He was then started on Coumadin as well. Once he stabilized, he was ambulated with cardiac rehab education through cardiac rehab and continued Lovenox, Coumadin crossover.  The patient had no other complaints and actually did quite well throughout hospitalization.  We are waiting for therapeutic INR.  He was transferred out of the step-down unit to the telemetry and continued with ambulation.  Please note the patient had no financial means to buy Lovenox as an outpatient.  Therefore, he was kept in the hospital.  By October 17, he was stable.  No chest pain.  No  shortness of breath.  No bleeding.  INR was 2.1.  PHYSICAL EXAMINATION:  VITAL SIGNS:  Blood pressure was 104/68, pulse 76, respirations 18, temp 97.5, oxygen saturation 96% on room air.  CBGs range 96 to 119. HEART:  Regular rate and rhythm.  No murmur. LUNGS:  Clear. ABDOMEN:  Soft, nontender, positive bowel sounds.  EXTREMITIES:  No edema.  Dr. Clarene [name] discussed with him triple anticoagulation therapy with Plavix, Coumadin and aspirin increases the risk of bleeding and would need close followup of his INR.  The patient continued to walk with his cane with cardiac rehab.  He had a steady gait.  Pharmacy recommended Coumadin dosing of 12.5 mg prior to discharge and then 10 mg on October 18 and follow up INR on October 19.  DISCHARGE MEDICATIONS:  See medication reconciliation  sheet.  DISCHARGE INSTRUCTIONS: 1. Increase activity slowly. 2. May shower. 3. No lifting for 2 weeks.  No driving for 2 days. 4. Low-sodium heart healthy diabetic diet. 5. Wash cath site with soap and water.  Call if any bleeding, swelling     or drainage. 6. Follow up with Dr. Rennis [name] in [place] on November 08, 2010 at 10:30     am. 7. Have Coumadin Clinic appointment in [place] on October 19th at 8     a.m. 8. Also monitor your glucose prior to taking your metformin,     glipizide.  RADIOLOGY:  CT angio of the chest done on October 11, bilateral segmental pulmonary emboli, small right pleural effusion, and coronary and aortic calcifications.  Chest x-ray on October 9, no active cardiopulmonary disease.  LABORATORY DATA:  Hemoglobin at discharge 15, hematocrit 43.2, WBC 5.3, platelets 223.  Protime 24, INR of 2.11.  Chemistry; sodium 137, potassium 4.4, chloride 100, CO2 29, glucose 90, BUN 14, creatinine 1.37, calcium 9.5.  Still on day of discharge is positive for blood. Though he did complain of hemorrhoids, we will need to monitor hemoglobin as an outpatient.  PSA was 0.38, free PSA was 0.15 and PSA percent free was 39.  These were done to evaluate for reason for DVT.  Cardiac enzymes; CK range 463 with an MB of 4.9, relative index of 1.1 to 460 with MB 5, relative index were all negative.  Last CK was 372 with MB of 4.1.  Troponin I peak was 0.49.  All others were less than 0.30.  Cholesterol 178, triglycerides 134, HDL 29, LDL 122.  TSH was 0.90.  A 2D echo, EF 55-60% with normal systolic function.  No regional wall motion abnormalities.  He did have grade 1 diastolic dysfunction. Valves appeared stable with no significant stenosis or regurg at all. There is no pericardial effusion, and he also had normal central venous pressure.  EKGs, sinus rhythm, normal EKGs.  DISCHARGE MEDICATIONS:  See medication reconciliation sheet.     [name] [name]. [name] [name],  N.P.   ______________________________ [place] Hilty, MD    LRI/MEDQ  D:  10/30/2010  T:  10/30/2010  Job:  [id number]  Electronically Signed by [name] [name] N.P. on 11/07/2010 09:52:53 AM Electronically Signed by Kirtland Bouchard. HILTY M.D. on 11/08/2010 08:07:11 AM

## 2010-11-20 ENCOUNTER — Telehealth: Payer: Self-pay

## 2010-11-20 NOTE — Telephone Encounter (Signed)
Contacted pt to triage for colonoscopy. He would like a call back after Thanksgiving. Placed on call back list for end of November 2012.

## 2010-11-26 ENCOUNTER — Encounter (HOSPITAL_COMMUNITY): Payer: Self-pay

## 2010-11-26 ENCOUNTER — Encounter (HOSPITAL_COMMUNITY)
Admission: RE | Admit: 2010-11-26 | Discharge: 2010-11-26 | Disposition: A | Discharge status: Home / Self Care | Payer: Medicare Other | Source: Ambulatory Visit | Attending: Internal Medicine | Admitting: Internal Medicine

## 2010-11-26 DIAGNOSIS — Z5189 Encounter for other specified aftercare: Secondary | ICD-10-CM | POA: Insufficient documentation

## 2010-11-26 DIAGNOSIS — I2510 Atherosclerotic heart disease of native coronary artery without angina pectoris: Secondary | ICD-10-CM | POA: Insufficient documentation

## 2010-11-26 DIAGNOSIS — I200 Unstable angina: Secondary | ICD-10-CM | POA: Insufficient documentation

## 2010-11-26 DIAGNOSIS — Z9861 Coronary angioplasty status: Secondary | ICD-10-CM | POA: Insufficient documentation

## 2010-11-26 NOTE — Patient Instructions (Signed)
Pt has finished orientation and is scheduled to start CR on 12/02/10 at 11 am. Pt has been instructed to arrive to class 15 minutes early for scheduled class. Pt has been instructed to wear comfortable clothing and shoes with rubber soles. Pt has been told to take their medications 1 hour prior to coming to class.  If the patient is not going to attend class, he/she has been instructed to call.

## 2010-11-26 NOTE — Progress Notes (Signed)
During orientation advised patient on arrival and appointment times what to wear, what to do before, during and after exercise. Reviewed attendance and class policy. Talked about inclement weather and class consultation policy. Pt is scheduled to start Cardiac Rehab on 12/02/10 at 11 am. Pt was advised to come to class 5 minutes before class starts. He was also given instructions on meeting with the dietician and attending the Family Structure classes. Pt is eager to get started.

## 2010-12-02 ENCOUNTER — Encounter (HOSPITAL_COMMUNITY)
Admission: RE | Admit: 2010-12-02 | Discharge: 2010-12-02 | Disposition: A | Discharge status: Home / Self Care | Payer: Medicare Other | Source: Ambulatory Visit | Attending: Internal Medicine | Admitting: Internal Medicine

## 2010-12-04 ENCOUNTER — Encounter (HOSPITAL_COMMUNITY): Payer: Medicare Other

## 2010-12-06 ENCOUNTER — Encounter (HOSPITAL_COMMUNITY): Payer: Medicare Other

## 2010-12-09 ENCOUNTER — Encounter (HOSPITAL_COMMUNITY): Payer: Medicare Other

## 2010-12-11 ENCOUNTER — Encounter (HOSPITAL_COMMUNITY): Payer: Medicare Other

## 2010-12-13 ENCOUNTER — Encounter (HOSPITAL_COMMUNITY): Payer: Medicare Other

## 2010-12-16 ENCOUNTER — Encounter (HOSPITAL_COMMUNITY): Payer: Medicare Other

## 2010-12-18 ENCOUNTER — Telehealth: Payer: Self-pay

## 2010-12-18 ENCOUNTER — Encounter (HOSPITAL_COMMUNITY): Payer: Medicare Other

## 2010-12-18 NOTE — Telephone Encounter (Signed)
Called pt to triage for colonoscopy. He said he is not having any problems and no family hx of colon cancer. He had a heart attack in October and will start some rehab next week. He is going to check with the cardiologist and see when he recommends him having it. He will call me back.

## 2010-12-20 ENCOUNTER — Encounter (HOSPITAL_COMMUNITY): Payer: Medicare Other

## 2010-12-23 ENCOUNTER — Encounter (HOSPITAL_COMMUNITY): Payer: Medicare Other

## 2010-12-25 ENCOUNTER — Encounter (HOSPITAL_COMMUNITY): Payer: Medicare Other

## 2010-12-27 ENCOUNTER — Encounter (HOSPITAL_COMMUNITY): Payer: Medicare Other

## 2010-12-30 ENCOUNTER — Encounter (HOSPITAL_COMMUNITY): Payer: Medicare Other

## 2010-12-30 ENCOUNTER — Encounter: Payer: Medicare Other | Admitting: *Deleted

## 2011-01-01 ENCOUNTER — Encounter: Payer: Medicare Other | Admitting: *Deleted

## 2011-01-01 ENCOUNTER — Encounter (HOSPITAL_COMMUNITY): Payer: Medicare Other

## 2011-01-03 ENCOUNTER — Encounter (HOSPITAL_COMMUNITY): Payer: Medicare Other

## 2011-01-06 ENCOUNTER — Encounter (HOSPITAL_COMMUNITY): Payer: Medicare Other

## 2011-01-08 ENCOUNTER — Encounter (HOSPITAL_COMMUNITY): Payer: Medicare Other

## 2011-01-10 ENCOUNTER — Encounter (HOSPITAL_COMMUNITY): Payer: Medicare Other

## 2011-01-13 ENCOUNTER — Encounter (HOSPITAL_COMMUNITY): Payer: Medicare Other

## 2011-01-15 ENCOUNTER — Encounter (HOSPITAL_COMMUNITY): Payer: Medicare Other

## 2011-01-17 ENCOUNTER — Encounter (HOSPITAL_COMMUNITY): Payer: Medicare Other

## 2011-01-20 ENCOUNTER — Encounter (HOSPITAL_COMMUNITY): Payer: Medicare Other

## 2011-01-22 ENCOUNTER — Encounter (HOSPITAL_COMMUNITY): Payer: Medicare Other

## 2011-01-24 ENCOUNTER — Encounter (HOSPITAL_COMMUNITY): Payer: Medicare Other

## 2011-01-27 ENCOUNTER — Encounter (HOSPITAL_COMMUNITY): Payer: Medicare Other

## 2011-01-29 ENCOUNTER — Encounter (HOSPITAL_COMMUNITY): Payer: Medicare Other

## 2011-01-31 ENCOUNTER — Encounter (HOSPITAL_COMMUNITY): Payer: Medicare Other

## 2011-02-03 ENCOUNTER — Encounter (HOSPITAL_COMMUNITY): Payer: Medicare Other

## 2011-02-05 ENCOUNTER — Encounter (HOSPITAL_COMMUNITY): Payer: Medicare Other

## 2011-02-07 ENCOUNTER — Encounter (HOSPITAL_COMMUNITY): Payer: Medicare Other

## 2011-02-10 ENCOUNTER — Encounter (HOSPITAL_COMMUNITY): Payer: Medicare Other

## 2011-02-12 ENCOUNTER — Encounter (HOSPITAL_COMMUNITY): Payer: Medicare Other

## 2011-02-14 ENCOUNTER — Encounter (HOSPITAL_COMMUNITY): Payer: Medicare Other

## 2011-02-17 ENCOUNTER — Encounter (HOSPITAL_COMMUNITY): Payer: Medicare Other

## 2011-02-19 ENCOUNTER — Encounter (HOSPITAL_COMMUNITY): Payer: Medicare Other

## 2011-02-21 ENCOUNTER — Encounter (HOSPITAL_COMMUNITY): Payer: Medicare Other

## 2011-02-24 ENCOUNTER — Encounter (HOSPITAL_COMMUNITY): Payer: Medicare Other

## 2011-02-26 ENCOUNTER — Encounter (HOSPITAL_COMMUNITY): Payer: Medicare Other

## 2011-02-28 ENCOUNTER — Encounter (HOSPITAL_COMMUNITY): Payer: Medicare Other

## 2011-03-03 ENCOUNTER — Encounter (HOSPITAL_COMMUNITY): Payer: Medicare Other

## 2011-03-05 ENCOUNTER — Encounter (HOSPITAL_COMMUNITY): Payer: Medicare Other

## 2011-03-07 ENCOUNTER — Encounter (HOSPITAL_COMMUNITY): Payer: Medicare Other

## 2011-09-11 DIAGNOSIS — R079 Chest pain, unspecified: Secondary | ICD-10-CM

## 2014-10-23 ENCOUNTER — Encounter (HOSPITAL_COMMUNITY): Payer: Self-pay | Admitting: Emergency Medicine

## 2014-10-23 ENCOUNTER — Emergency Department (HOSPITAL_COMMUNITY): Payer: Medicare Other

## 2014-10-23 ENCOUNTER — Observation Stay (HOSPITAL_COMMUNITY)
Admission: EM | Admit: 2014-10-23 | Discharge: 2014-10-25 | Disposition: A | Discharge status: Home / Self Care | Payer: Medicare Other | Attending: Family Medicine | Admitting: Family Medicine

## 2014-10-23 DIAGNOSIS — Z8673 Personal history of transient ischemic attack (TIA), and cerebral infarction without residual deficits: Secondary | ICD-10-CM | POA: Diagnosis not present

## 2014-10-23 DIAGNOSIS — S4992XA Unspecified injury of left shoulder and upper arm, initial encounter: Secondary | ICD-10-CM | POA: Diagnosis not present

## 2014-10-23 DIAGNOSIS — I2510 Atherosclerotic heart disease of native coronary artery without angina pectoris: Secondary | ICD-10-CM | POA: Insufficient documentation

## 2014-10-23 DIAGNOSIS — M25512 Pain in left shoulder: Secondary | ICD-10-CM | POA: Diagnosis present

## 2014-10-23 DIAGNOSIS — Y9289 Other specified places as the place of occurrence of the external cause: Secondary | ICD-10-CM | POA: Diagnosis not present

## 2014-10-23 DIAGNOSIS — M1990 Unspecified osteoarthritis, unspecified site: Secondary | ICD-10-CM | POA: Insufficient documentation

## 2014-10-23 DIAGNOSIS — Z7901 Long term (current) use of anticoagulants: Secondary | ICD-10-CM | POA: Insufficient documentation

## 2014-10-23 DIAGNOSIS — Y9389 Activity, other specified: Secondary | ICD-10-CM | POA: Insufficient documentation

## 2014-10-23 DIAGNOSIS — Y998 Other external cause status: Secondary | ICD-10-CM | POA: Insufficient documentation

## 2014-10-23 DIAGNOSIS — I10 Essential (primary) hypertension: Secondary | ICD-10-CM | POA: Diagnosis present

## 2014-10-23 DIAGNOSIS — F419 Anxiety disorder, unspecified: Secondary | ICD-10-CM | POA: Insufficient documentation

## 2014-10-23 DIAGNOSIS — W19XXXA Unspecified fall, initial encounter: Secondary | ICD-10-CM | POA: Diagnosis not present

## 2014-10-23 DIAGNOSIS — E876 Hypokalemia: Secondary | ICD-10-CM | POA: Diagnosis present

## 2014-10-23 DIAGNOSIS — W1839XA Other fall on same level, initial encounter: Secondary | ICD-10-CM | POA: Insufficient documentation

## 2014-10-23 DIAGNOSIS — F1010 Alcohol abuse, uncomplicated: Secondary | ICD-10-CM | POA: Diagnosis not present

## 2014-10-23 DIAGNOSIS — Z794 Long term (current) use of insulin: Secondary | ICD-10-CM | POA: Insufficient documentation

## 2014-10-23 DIAGNOSIS — Z791 Long term (current) use of non-steroidal anti-inflammatories (NSAID): Secondary | ICD-10-CM | POA: Insufficient documentation

## 2014-10-23 DIAGNOSIS — E119 Type 2 diabetes mellitus without complications: Secondary | ICD-10-CM | POA: Diagnosis not present

## 2014-10-23 DIAGNOSIS — Z720 Tobacco use: Secondary | ICD-10-CM | POA: Diagnosis present

## 2014-10-23 DIAGNOSIS — R531 Weakness: Secondary | ICD-10-CM

## 2014-10-23 HISTORY — DX: Cerebral infarction, unspecified: I63.9

## 2014-10-23 HISTORY — DX: Acute myocardial infarction, unspecified: I21.9

## 2014-10-23 LAB — HEPATIC FUNCTION PANEL
ALT: 14 U/L — AB (ref 17–63)
AST: 23 U/L (ref 15–41)
Albumin: 2.8 g/dL — ABNORMAL LOW (ref 3.5–5.0)
Alkaline Phosphatase: 61 U/L (ref 38–126)
BILIRUBIN DIRECT: 0.2 mg/dL (ref 0.1–0.5)
Indirect Bilirubin: 0.3 mg/dL (ref 0.3–0.9)
TOTAL PROTEIN: 5.6 g/dL — AB (ref 6.5–8.1)
Total Bilirubin: 0.5 mg/dL (ref 0.3–1.2)

## 2014-10-23 LAB — BASIC METABOLIC PANEL
ANION GAP: 5 (ref 5–15)
BUN: 6 mg/dL (ref 6–20)
CALCIUM: 6.2 mg/dL — AB (ref 8.9–10.3)
CO2: 21 mmol/L — AB (ref 22–32)
CREATININE: 0.76 mg/dL (ref 0.61–1.24)
Chloride: 112 mmol/L — ABNORMAL HIGH (ref 101–111)
Glucose, Bld: 109 mg/dL — ABNORMAL HIGH (ref 65–99)
Potassium: 2.5 mmol/L — CL (ref 3.5–5.1)
SODIUM: 138 mmol/L (ref 135–145)

## 2014-10-23 LAB — CBC WITH DIFFERENTIAL/PLATELET
BASOS ABS: 0 10*3/uL (ref 0.0–0.1)
BASOS PCT: 0 %
EOS ABS: 0.1 10*3/uL (ref 0.0–0.7)
Eosinophils Relative: 2 %
HEMATOCRIT: 42.8 % (ref 39.0–52.0)
HEMOGLOBIN: 14.9 g/dL (ref 13.0–17.0)
Lymphocytes Relative: 34 %
Lymphs Abs: 2 10*3/uL (ref 0.7–4.0)
MCH: 30.5 pg (ref 26.0–34.0)
MCHC: 34.8 g/dL (ref 30.0–36.0)
MCV: 87.7 fL (ref 78.0–100.0)
Monocytes Absolute: 0.5 10*3/uL (ref 0.1–1.0)
Monocytes Relative: 9 %
NEUTROS ABS: 3.2 10*3/uL (ref 1.7–7.7)
NEUTROS PCT: 55 %
Platelets: 246 10*3/uL (ref 150–400)
RBC: 4.88 MIL/uL (ref 4.22–5.81)
RDW: 13.5 % (ref 11.5–15.5)
WBC: 5.8 10*3/uL (ref 4.0–10.5)

## 2014-10-23 LAB — TROPONIN I

## 2014-10-23 LAB — PROTIME-INR
INR: 1.06 (ref 0.00–1.49)
Prothrombin Time: 14 seconds (ref 11.6–15.2)

## 2014-10-23 LAB — BRAIN NATRIURETIC PEPTIDE: B NATRIURETIC PEPTIDE 5: 12 pg/mL (ref 0.0–100.0)

## 2014-10-23 MED ORDER — HYDROCODONE-ACETAMINOPHEN 5-325 MG PO TABS
ORAL_TABLET | ORAL | Status: AC
  Filled 2014-10-23: qty 2

## 2014-10-23 MED ORDER — GLIPIZIDE 5 MG PO TABS
10.0000 mg | ORAL_TABLET | Freq: Two times a day (BID) | ORAL | Status: DC
  Administered 2014-10-24: 10 mg via ORAL
  Filled 2014-10-23: qty 2

## 2014-10-23 MED ORDER — INSULIN ASPART 100 UNIT/ML ~~LOC~~ SOLN
0.0000 [IU] | Freq: Three times a day (TID) | SUBCUTANEOUS | Status: DC
  Administered 2014-10-24: 5 [IU] via SUBCUTANEOUS
  Administered 2014-10-25 (×2): 2 [IU] via SUBCUTANEOUS

## 2014-10-23 MED ORDER — LORAZEPAM 2 MG/ML IJ SOLN: 1.0000 mg | Freq: Four times a day (QID) | INTRAMUSCULAR | Status: DC | PRN

## 2014-10-23 MED ORDER — LOSARTAN POTASSIUM 50 MG PO TABS
25.0000 mg | ORAL_TABLET | Freq: Every day | ORAL | Status: DC
  Administered 2014-10-24: 11:00:00 via ORAL
  Administered 2014-10-25: 25 mg via ORAL
  Filled 2014-10-23 (×2): qty 1

## 2014-10-23 MED ORDER — HYDROCODONE-ACETAMINOPHEN 5-325 MG PO TABS
2.0000 | ORAL_TABLET | Freq: Once | ORAL | Status: AC
  Administered 2014-10-23: 2 via ORAL

## 2014-10-23 MED ORDER — ROSUVASTATIN CALCIUM 20 MG PO TABS
20.0000 mg | ORAL_TABLET | Freq: Every day | ORAL | Status: DC
  Administered 2014-10-24 – 2014-10-25 (×2): 20 mg via ORAL
  Filled 2014-10-23 (×2): qty 1

## 2014-10-23 MED ORDER — POTASSIUM CHLORIDE 10 MEQ/100ML IV SOLN
10.0000 meq | Freq: Once | INTRAVENOUS | Status: AC
  Administered 2014-10-23: 10 meq via INTRAVENOUS
  Filled 2014-10-23: qty 100

## 2014-10-23 MED ORDER — METFORMIN HCL 500 MG PO TABS
1000.0000 mg | ORAL_TABLET | Freq: Two times a day (BID) | ORAL | Status: DC
  Administered 2014-10-24 – 2014-10-25 (×2): 1000 mg via ORAL
  Filled 2014-10-23 (×2): qty 2

## 2014-10-23 MED ORDER — POTASSIUM CHLORIDE CRYS ER 20 MEQ PO TBCR
40.0000 meq | EXTENDED_RELEASE_TABLET | Freq: Every day | ORAL | Status: DC
  Administered 2014-10-24 – 2014-10-25 (×2): 40 meq via ORAL
  Filled 2014-10-23 (×2): qty 2

## 2014-10-23 MED ORDER — ADULT MULTIVITAMIN W/MINERALS CH
1.0000 | ORAL_TABLET | Freq: Every day | ORAL | Status: DC
  Administered 2014-10-24 – 2014-10-25 (×2): 1 via ORAL
  Filled 2014-10-23 (×2): qty 1

## 2014-10-23 MED ORDER — FOLIC ACID 1 MG PO TABS
1.0000 mg | ORAL_TABLET | Freq: Every day | ORAL | Status: DC
  Administered 2014-10-24 – 2014-10-25 (×2): 1 mg via ORAL
  Filled 2014-10-23 (×2): qty 1

## 2014-10-23 MED ORDER — INSULIN ASPART 100 UNIT/ML ~~LOC~~ SOLN
0.0000 [IU] | Freq: Every day | SUBCUTANEOUS | Status: DC
Start: 2014-10-23 — End: 2014-10-25
  Administered 2014-10-24: 2 [IU] via SUBCUTANEOUS

## 2014-10-23 MED ORDER — THIAMINE HCL 100 MG/ML IJ SOLN
Freq: Once | INTRAVENOUS | Status: AC
  Administered 2014-10-24: 02:00:00 via INTRAVENOUS
  Filled 2014-10-23: qty 1000

## 2014-10-23 MED ORDER — LORAZEPAM 1 MG PO TABS: 1.0000 mg | ORAL_TABLET | Freq: Four times a day (QID) | ORAL | Status: DC | PRN

## 2014-10-23 MED ORDER — POTASSIUM CHLORIDE CRYS ER 20 MEQ PO TBCR
40.0000 meq | EXTENDED_RELEASE_TABLET | Freq: Once | ORAL | Status: AC
  Administered 2014-10-23: 40 meq via ORAL
  Filled 2014-10-23: qty 2

## 2014-10-23 MED ORDER — INSULIN DETEMIR 100 UNIT/ML ~~LOC~~ SOLN
10.0000 [IU] | Freq: Every day | SUBCUTANEOUS | Status: DC
  Filled 2014-10-23: qty 0.1

## 2014-10-23 MED ORDER — CLOPIDOGREL BISULFATE 75 MG PO TABS
75.0000 mg | ORAL_TABLET | Freq: Every day | ORAL | Status: DC
  Administered 2014-10-24 – 2014-10-25 (×2): 75 mg via ORAL
  Filled 2014-10-23 (×2): qty 1

## 2014-10-23 MED ORDER — POTASSIUM CHLORIDE 10 MEQ/100ML IV SOLN
10.0000 meq | INTRAVENOUS | Status: AC
  Administered 2014-10-24 (×3): 10 meq via INTRAVENOUS
  Filled 2014-10-23 (×2): qty 100

## 2014-10-23 MED ORDER — METOPROLOL TARTRATE 25 MG PO TABS
25.0000 mg | ORAL_TABLET | Freq: Two times a day (BID) | ORAL | Status: DC
  Administered 2014-10-24 – 2014-10-25 (×3): 25 mg via ORAL
  Filled 2014-10-23 (×3): qty 1

## 2014-10-23 MED ORDER — VITAMIN B-1 100 MG PO TABS
100.0000 mg | ORAL_TABLET | Freq: Every day | ORAL | Status: DC
  Administered 2014-10-24 – 2014-10-25 (×2): 100 mg via ORAL
  Filled 2014-10-23 (×2): qty 1

## 2014-10-23 MED ORDER — ENOXAPARIN SODIUM 40 MG/0.4ML ~~LOC~~ SOLN: 40.0000 mg | SUBCUTANEOUS | Status: DC

## 2014-10-23 MED ORDER — SODIUM CHLORIDE 0.9 % IJ SOLN
3.0000 mL | Freq: Two times a day (BID) | INTRAMUSCULAR | Status: DC
  Administered 2014-10-24 – 2014-10-25 (×3): 3 mL via INTRAVENOUS

## 2014-10-23 MED ORDER — SODIUM CHLORIDE 0.9 % IV SOLN
1.0000 g | Freq: Once | INTRAVENOUS | Status: AC
  Administered 2014-10-23: 1 g via INTRAVENOUS
  Filled 2014-10-23: qty 10

## 2014-10-23 NOTE — ED Notes (Signed)
Gave patient a Coke and graham crackers per request.

## 2014-10-23 NOTE — H&P (Signed)
[facility] History and Physical  [name] Walker ZOX:[id number] DOB: 04-Jul-1955    PCP:   PROVIDER NOT IN SYSTEM   Chief Complaint: Falling and left shoulder pain.   HPI: [name] Walker is an 59 y.o. male with hx of known CAD, s/p prior CVA, hx of bilateral pulmonary emboli and DVT on life long Coumadin, tobacco abuse, alcohol abuse (intermittent, last use 2 days ago, a fifth and several beers), DM, medical non compliance as he hadn't taken his med for at least a couple of weeks (travel, has no medications), hx of prior MI and CVA, presented to the ER as he felt weak, fell and hurt his left shoulder.  He was found to have K of 2.5, Calcium of 6.5, unknown albumin, and negative shoulder films.  Hospitalist was asked to admit him for hypokalemia, having weakness and falling.  He was given IV Potassium in the ER. His EKG showed NSR with QTc of 450 ms, and his CXR was clear.  He denied being on diuretics, having nausea, vomiting or diarrhea, but has had polyuria.  He did not feel any palpitation, and did not have any LOC with his fall.  He has not been eating well, as he is homeless at this time, having no place to go.  He came from [place] to help his brother who had a recent stroke.   Rewiew of Systems:  Constitutional: Negative for  fever and chills. No significant weight loss or weight gain Eyes: Negative for eye pain, redness and discharge, diplopia, visual changes, or flashes of light. ENMT: Negative for ear pain, hoarseness, nasal congestion, sinus pressure and sore throat. No headaches; tinnitus, drooling, or problem swallowing. Cardiovascular: Negative for chest pain, palpitations, diaphoresis, dyspnea and peripheral edema. ; No orthopnea, PND Respiratory: Negative for cough, hemoptysis, wheezing and stridor. No pleuritic chestpain. Gastrointestinal: Negative for nausea, vomiting, diarrhea, constipation, abdominal pain, melena, blood in stool, hematemesis, jaundice and rectal  bleeding.    Genitourinary: Negative for frequency, dysuria, incontinence,flank pain and hematuria; Musculoskeletal: Negative for back pain and neck pain. Negative for swelling and trauma.;  Skin: . Negative for pruritus, rash, abrasions, bruising and skin lesion.; ulcerations Neuro: Negative for headache,  and neck stiffness. Negative for altered level of consciousness , altered mental status, extremity weakness, burning feet, involuntary movement, seizure and syncope.  Psych: negative for anxiety, depression, insomnia, tearfulness, panic attacks, hallucinations, paranoia, suicidal or homicidal ideation    Past Medical History  Diagnosis Date  . Hypertension   . Diabetes mellitus   . Anxiety   . Arthritis   . MI (myocardial infarction) (HCC)   . Stroke (cerebrum) [facility])     Past Surgical History  Procedure Laterality Date  . Coronary stent placement      Medications:  HOME MEDS: Prior to Admission medications   Medication Sig Start Date End Date Taking? Authorizing Provider  clopidogrel (PLAVIX) 75 MG tablet Take 75 mg by mouth daily.     Yes Historical Provider, MD  cyclobenzaprine (FLEXERIL) 10 MG tablet Take 10 mg by mouth every 8 (eight) hours as needed for muscle spasms.   Yes Historical Provider, MD  glipiZIDE (GLUCOTROL) 10 MG tablet Take 10 mg by mouth 2 (two) times daily before a meal.   Yes Historical Provider, MD  insulin detemir (LEVEMIR) 100 UNIT/ML injection Inject 10 Units into the skin at bedtime.   Yes Historical Provider, MD  losartan (COZAAR) 25 MG tablet Take 25 mg by mouth daily.   Yes Historical Provider,  MD  meloxicam (MOBIC) 15 MG tablet Take 15 mg by mouth daily.   Yes Historical Provider, MD  metFORMIN (GLUCOPHAGE) 1000 MG tablet Take 1,000 mg by mouth 2 (two) times daily with a meal.   Yes Historical Provider, MD  metoprolol tartrate (LOPRESSOR) 25 MG tablet Take 25 mg by mouth 2 (two) times daily.     Yes Historical Provider, MD  nitroGLYCERIN  (NITROSTAT) 0.4 MG SL tablet Place 0.4 mg under the tongue every 5 (five) minutes as needed.     Yes Historical Provider, MD  rosuvastatin (CRESTOR) 20 MG tablet Take 20 mg by mouth daily.     Yes Historical Provider, MD  warfarin (COUMADIN) 5 MG tablet Take 2.5-5 mg by mouth daily at 6 PM. Take 5 mg on Sunday's and 2.5 mg on all other days.   Yes Historical Provider, MD     Allergies:  No Known Allergies  Social History:   reports that he has been smoking Cigarettes.  He has been smoking about 0.05 packs per day. He does not have any smokeless tobacco history on file. He reports that he drinks alcohol. He reports that he uses illicit drugs (Marijuana).  Family History: Family History  Problem Relation Age of Onset  . Heart disease Mother   . Heart disease Father      Physical Exam: Filed Vitals:   10/23/14 1900 10/23/14 1915 10/23/14 1930 10/23/14 1945  BP: 143/96  150/101   Pulse: 91 95  97  Temp:      TempSrc:      Resp: 19  28 18   Height:      Weight:      SpO2: 98% 97%  97%   Blood pressure 150/101, pulse 97, temperature 97.7 F (36.5 C), temperature source Oral, resp. rate 18, height 6\' 1"  (1.854 m), weight 113.399 kg (250 lb), SpO2 97 %.  GEN:  Pleasant patient lying in the stretcher in no acute distress; cooperative with exam. PSYCH:  alert and oriented x4; does not appear anxious or depressed; affect is appropriate. HEENT: Mucous membranes pink and anicteric; PERRLA; EOM intact; no cervical lymphadenopathy nor thyromegaly or carotid bruit; no JVD; There were no stridor. Neck is very supple. Breasts:: Not examined CHEST WALL: No tenderness CHEST: Normal respiration, clear to auscultation bilaterally.  HEART: Regular rate and rhythm.  There are no murmur, rub, or gallops.   BACK: No kyphosis or scoliosis; no CVA tenderness ABDOMEN: soft and non-tender; no masses, no organomegaly, normal abdominal bowel sounds; no pannus; no intertriginous candida. There is no rebound  and no distention. Rectal Exam: Not done EXTREMITIES: No bone or joint deformity; age-appropriate arthropathy of the hands and knees; no edema; no ulcerations.  There is no calf tenderness. Genitalia: not examined PULSES: 2+ and symmetric SKIN: Normal hydration no rash or ulceration CNS: Cranial nerves 2-12 grossly intact no focal lateralizing neurologic deficit.  Speech is fluent; uvula elevated with phonation, facial symmetry and tongue midline. DTR are normal bilaterally, cerebella exam is intact, barbinski is negative and strengths are equaled bilaterally.  No sensory loss.   Labs on Admission:  Basic Metabolic Panel:  Recent Labs Lab 10/23/14 1558  NA 138  K 2.5*  CL 112*  CO2 21*  GLUCOSE 109*  BUN 6  CREATININE 0.76  CALCIUM 6.2*   Liver Function Tests:  Recent Labs Lab 10/23/14 1558  AST 23  ALT 14*  ALKPHOS 61  BILITOT 0.5  PROT 5.6*  ALBUMIN 2.8*   CBC:  Recent Labs Lab 10/23/14 1558  WBC 5.8  NEUTROABS 3.2  HGB 14.9  HCT 42.8  MCV 87.7  PLT 246   Cardiac Enzymes:  Recent Labs Lab 10/23/14 1558  TROPONINI <0.03   Radiological Exams on Admission: Dg Chest 2 View  10/23/2014   CLINICAL DATA:  Lower leg swelling  EXAM: CHEST  2 VIEW  COMPARISON:  09/11/2011  FINDINGS: Normal heart size. Focal eventration of the left hemidiaphragm is stable. Clear lungs. No pleural effusion and no pneumothorax  IMPRESSION: No active cardiopulmonary disease.   Electronically Signed   By: Jolaine Click M.D.   On: 10/23/2014 16:00   Dg Shoulder Left  10/23/2014   CLINICAL DATA:  Patient fell on left side with left shoulder pain, initial encounter  EXAM: LEFT SHOULDER - 2+ VIEW  COMPARISON:  None.  FINDINGS: Mild glenohumeral arthritis.  No fracture or dislocation.  IMPRESSION: No acute findings.   Electronically Signed   By: Esperanza Heir M.D.   On: 10/23/2014 13:44    EKG: Independently reviewed.  Assessment/Plan Present on Admission:  . Hypokalemia .  Hypocalcemia . HTN (hypertension) . Tobacco abuse . Alcohol abuse . Left shoulder pain . Fall  PLAN: Will admit him to telemetry and replete his K.  Would like to know his albumin to determine his calculated calcium level.  Will need to replete both about the same time.  I suspect he had low K and low calcium from poor eating.  Will give him some IVF.  Will place him on CIWA protocol, along with given banana bag IV.  For his DM, will given SSI, and place on carb modified diet.  For his hx of bilateral PE and DVT, will continue with his Coumadin per pharmacy dosing.  He was encouraged to quit cigarettes.  He is stable, full code, and will be admitted OBS to [facility] service.  Thank you and Good day.  Other plans as per orders.  Code Status: FULL Unk Lightning, MD. [facility] Pager [phone] 7pm to 7am.  10/23/2014, 7:48 PM

## 2014-10-23 NOTE — ED Provider Notes (Signed)
CSN: [id number]     Arrival date & time 10/23/14  1300 History   First MD Initiated Contact with Patient 10/23/14 1521     Chief Complaint  Patient presents with  . Fall     (Consider location/radiation/quality/duration/timing/severity/associated sxs/prior Treatment) HPI Comments: Patient is a 59 year old male with history of diabetes, hypertension, coronary artery disease, CVA. He presents for evaluation of a fall. He states that yesterday he lost his balance and fell, injuring his left shoulder. He describes pain when he moves his arm. He denies any numbness or tingling. He reports that he has had increased swelling in his legs and he feels occasionally short of breath. He denies any fevers or chills. Denies any chest pain or fever.  Patient is a 59 y.o. male presenting with fall. The history is provided by the patient.  Fall This is a new problem. The current episode started yesterday. The problem has not changed since onset.Associated symptoms include shortness of breath. Pertinent negatives include no chest pain. Nothing aggravates the symptoms. He has tried nothing for the symptoms. The treatment provided no relief.    Past Medical History  Diagnosis Date  . Hypertension   . Diabetes mellitus   . Anxiety   . Arthritis   . MI (myocardial infarction) (HCC)   . Stroke (cerebrum) [facility])    Past Surgical History  Procedure Laterality Date  . Coronary stent placement     Family History  Problem Relation Age of Onset  . Heart disease Mother   . Heart disease Father    Social History  Substance Use Topics  . Smoking status: Current Every Day Smoker -- 0.05 packs/day    Types: Cigarettes  . Smokeless tobacco: None  . Alcohol Use: Yes     Comment: occ. use    Review of Systems  Respiratory: Positive for shortness of breath.   Cardiovascular: Negative for chest pain.  All other systems reviewed and are negative.     Allergies  Review of patient's allergies indicates no  known allergies.  Home Medications   Prior to Admission medications   Medication Sig Start Date End Date Taking? Authorizing Provider  clopidogrel (PLAVIX) 75 MG tablet Take 75 mg by mouth daily.     Yes Historical Provider, MD  cyclobenzaprine (FLEXERIL) 10 MG tablet Take 10 mg by mouth every 8 (eight) hours as needed for muscle spasms.   Yes Historical Provider, MD  glipiZIDE (GLUCOTROL) 10 MG tablet Take 10 mg by mouth 2 (two) times daily before a meal.   Yes Historical Provider, MD  insulin detemir (LEVEMIR) 100 UNIT/ML injection Inject 10 Units into the skin at bedtime.   Yes Historical Provider, MD  losartan (COZAAR) 25 MG tablet Take 25 mg by mouth daily.   Yes Historical Provider, MD  meloxicam (MOBIC) 15 MG tablet Take 15 mg by mouth daily.   Yes Historical Provider, MD  metFORMIN (GLUCOPHAGE) 1000 MG tablet Take 1,000 mg by mouth 2 (two) times daily with a meal.   Yes Historical Provider, MD  metoprolol tartrate (LOPRESSOR) 25 MG tablet Take 25 mg by mouth 2 (two) times daily.     Yes Historical Provider, MD  nitroGLYCERIN (NITROSTAT) 0.4 MG SL tablet Place 0.4 mg under the tongue every 5 (five) minutes as needed.     Yes Historical Provider, MD  rosuvastatin (CRESTOR) 20 MG tablet Take 20 mg by mouth daily.     Yes Historical Provider, MD  warfarin (COUMADIN) 5 MG tablet Take 2.5-5  mg by mouth daily at 6 PM. Take 5 mg on Sunday's and 2.5 mg on all other days.   Yes Historical Provider, MD   BP 150/97 mmHg  Pulse 98  Temp(Src) 97.8 F (36.6 C) (Oral)  Resp 16  Ht  (1.854 m)  Wt 250 lb (113.399 kg)  BMI 32.99 kg/m2  SpO2 100% Physical Exam  Constitutional: He is oriented to person, place, and time. He appears well-developed and well-nourished. No distress.  HENT:  Head: Normocephalic and atraumatic.  Mouth/Throat: Oropharynx is clear and moist.  Neck: Normal range of motion. Neck supple.  Cardiovascular: Normal rate, regular rhythm and normal heart sounds.   No murmur  heard. Pulmonary/Chest: Effort normal and breath sounds normal. No respiratory distress. He has no wheezes. He has no rales.  Abdominal: Soft. Bowel sounds are normal. He exhibits no distension. There is no tenderness.  Musculoskeletal: Normal range of motion. He exhibits no edema.  There is tenderness to palpation of the left anterior, lateral, and posterior shoulder. He has good range of motion. The distal ulnar and radial pulses are easily palpable. He is able to flex, extend, and oppose all fingers without difficulty. Sensation is intact throughout the entire hand.  Lymphadenopathy:    He has no cervical adenopathy.  Neurological: He is alert and oriented to person, place, and time.  Skin: Skin is warm and dry. He is not diaphoretic.  Nursing note and vitals reviewed.   ED Course  Procedures (including critical care time) Labs Review Labs Reviewed - No data to display  Imaging Review Dg Shoulder Left  10/23/2014   CLINICAL DATA:  Patient fell on left side with left shoulder pain, initial encounter  EXAM: LEFT SHOULDER - 2+ VIEW  COMPARISON:  None.  FINDINGS: Mild glenohumeral arthritis.  No fracture or dislocation.  IMPRESSION: No acute findings.   Electronically Signed   By: Esperanza Heir M.D.   On: 10/23/2014 13:44   I have personally reviewed and evaluated these images and lab results as part of my medical decision-making.   EKG Interpretation   Date/Time:  Monday October 23 2014 17:31:01 EDT Ventricular Rate:  94 PR Interval:  156 QRS Duration: 91 QT Interval:  361 QTC Calculation: 451 R Axis:   6 Text Interpretation:  Sinus rhythm Normal ECG Confirmed by Mikiya Nebergall  MD,  Ebany Bowermaster ([id number]) on 10/23/2014 5:36:24 PM      MDM   Final diagnoses:  None    Patient presents with complaints of weakness and leg swelling. He fell outside in injured his left shoulder. His x-rays are negative for fracture. His workup does reveal hypokalemia and hypo-calcemia, however there are no  EKG changes. He was given IV and oral potassium replacement and his hepatic function panel is pending at this time. I have discussed case with Dr. Conley Rolls who agrees to admit.    Geoffery Lyons, MD 10/23/14 [phone]

## 2014-10-23 NOTE — ED Notes (Addendum)
PT stated he tripped and fell outside onto his left arm. PT also c/o swelling to bilateral lower legs. PT c/o pain with ROM to left shoulder.

## 2014-10-24 DIAGNOSIS — S4992XA Unspecified injury of left shoulder and upper arm, initial encounter: Secondary | ICD-10-CM | POA: Diagnosis not present

## 2014-10-24 DIAGNOSIS — E876 Hypokalemia: Secondary | ICD-10-CM | POA: Diagnosis not present

## 2014-10-24 DIAGNOSIS — W19XXXD Unspecified fall, subsequent encounter: Secondary | ICD-10-CM

## 2014-10-24 DIAGNOSIS — E118 Type 2 diabetes mellitus with unspecified complications: Secondary | ICD-10-CM

## 2014-10-24 DIAGNOSIS — F1010 Alcohol abuse, uncomplicated: Secondary | ICD-10-CM | POA: Diagnosis not present

## 2014-10-24 LAB — GLUCOSE, CAPILLARY
GLUCOSE-CAPILLARY: 214 mg/dL — AB (ref 65–99)
GLUCOSE-CAPILLARY: 45 mg/dL — AB (ref 65–99)
GLUCOSE-CAPILLARY: 119 mg/dL — AB (ref 65–99)
Glucose-Capillary: 106 mg/dL — ABNORMAL HIGH (ref 65–99)
Glucose-Capillary: 109 mg/dL — ABNORMAL HIGH (ref 65–99)
Glucose-Capillary: 221 mg/dL — ABNORMAL HIGH (ref 65–99)
Glucose-Capillary: 61 mg/dL — ABNORMAL LOW (ref 65–99)

## 2014-10-24 LAB — BASIC METABOLIC PANEL
ANION GAP: 5 (ref 5–15)
Anion gap: 6 (ref 5–15)
Anion gap: 8 (ref 5–15)
Anion gap: 7 (ref 5–15)
Anion gap: 5 (ref 5–15)
Anion gap: 4 — ABNORMAL LOW (ref 5–15)
BUN: 6 mg/dL (ref 6–20)
BUN: 7 mg/dL (ref 6–20)
BUN: 7 mg/dL (ref 6–20)
BUN: 7 mg/dL (ref 6–20)
BUN: 8 mg/dL (ref 6–20)
BUN: 8 mg/dL (ref 6–20)
CALCIUM: 9.3 mg/dL (ref 8.9–10.3)
CALCIUM: 8.9 mg/dL (ref 8.9–10.3)
CHLORIDE: 106 mmol/L (ref 101–111)
CHLORIDE: 106 mmol/L (ref 101–111)
CHLORIDE: 107 mmol/L (ref 101–111)
CHLORIDE: 99 mmol/L — AB (ref 101–111)
CO2: 23 mmol/L (ref 22–32)
CO2: 24 mmol/L (ref 22–32)
CO2: 27 mmol/L (ref 22–32)
CO2: 27 mmol/L (ref 22–32)
CO2: 27 mmol/L (ref 22–32)
CO2: 29 mmol/L (ref 22–32)
CREATININE: 1.14 mg/dL (ref 0.61–1.24)
CREATININE: 1.16 mg/dL (ref 0.61–1.24)
CREATININE: 1.17 mg/dL (ref 0.61–1.24)
CREATININE: 1.2 mg/dL (ref 0.61–1.24)
CREATININE: 1.21 mg/dL (ref 0.61–1.24)
Calcium: 8.3 mg/dL — ABNORMAL LOW (ref 8.9–10.3)
Calcium: 8.6 mg/dL — ABNORMAL LOW (ref 8.9–10.3)
Calcium: 8.6 mg/dL — ABNORMAL LOW (ref 8.9–10.3)
Calcium: 8.9 mg/dL (ref 8.9–10.3)
Chloride: 109 mmol/L (ref 101–111)
Chloride: 104 mmol/L (ref 101–111)
Creatinine, Ser: 1.19 mg/dL (ref 0.61–1.24)
GFR calc non Af Amer: >60 mL/min (ref >60)
GFR calc non Af Amer: >60 mL/min (ref >60)
GFR calc non Af Amer: >60 mL/min (ref >60)
GFR calc non Af Amer: >60 mL/min (ref >60)
GFR calc non Af Amer: >60 mL/min (ref >60)
GLUCOSE: 132 mg/dL — AB (ref 65–99)
GLUCOSE: 62 mg/dL — AB (ref 65–99)
GLUCOSE: 182 mg/dL — AB (ref 65–99)
Glucose, Bld: 159 mg/dL — ABNORMAL HIGH (ref 65–99)
Glucose, Bld: 130 mg/dL — ABNORMAL HIGH (ref 65–99)
Glucose, Bld: 157 mg/dL — ABNORMAL HIGH (ref 65–99)
POTASSIUM: 3.5 mmol/L (ref 3.5–5.1)
POTASSIUM: 4.1 mmol/L (ref 3.5–5.1)
POTASSIUM: 4.5 mmol/L (ref 3.5–5.1)
Potassium: 4 mmol/L (ref 3.5–5.1)
Potassium: 4.3 mmol/L (ref 3.5–5.1)
Potassium: 3.9 mmol/L (ref 3.5–5.1)
SODIUM: 132 mmol/L — AB (ref 135–145)
SODIUM: 137 mmol/L (ref 135–145)
SODIUM: 141 mmol/L (ref 135–145)
Sodium: 138 mmol/L (ref 135–145)
Sodium: 138 mmol/L (ref 135–145)
Sodium: 137 mmol/L (ref 135–145)

## 2014-10-24 LAB — TSH: TSH: 1.142 u[IU]/mL (ref 0.350–4.500)

## 2014-10-24 LAB — PROTIME-INR
INR: 1.19 (ref 0.00–1.49)
Prothrombin Time: 15.3 seconds — ABNORMAL HIGH (ref 11.6–15.2)

## 2014-10-24 LAB — MAGNESIUM: MAGNESIUM: 1.9 mg/dL (ref 1.7–2.4)

## 2014-10-24 LAB — PHOSPHORUS: PHOSPHORUS: 3.3 mg/dL (ref 2.5–4.6)

## 2014-10-24 MED ORDER — ENOXAPARIN SODIUM 120 MG/0.8ML ~~LOC~~ SOLN
110.0000 mg | Freq: Two times a day (BID) | SUBCUTANEOUS | Status: DC
  Administered 2014-10-24: 110 mg via SUBCUTANEOUS
  Filled 2014-10-24 (×3): qty 0.8

## 2014-10-24 MED ORDER — ENOXAPARIN SODIUM 120 MG/0.8ML ~~LOC~~ SOLN
110.0000 mg | Freq: Once | SUBCUTANEOUS | Status: AC
Start: 2014-10-25 — End: 2014-10-25
  Administered 2014-10-25: 110 mg via SUBCUTANEOUS
  Filled 2014-10-24: qty 0.8

## 2014-10-24 MED ORDER — M.V.I. ADULT IV INJ
INJECTION | INTRAVENOUS | Status: AC
  Filled 2014-10-24: qty 10

## 2014-10-24 MED ORDER — RIVAROXABAN 20 MG PO TABS: 20.0000 mg | ORAL_TABLET | Freq: Every day | ORAL | Status: DC

## 2014-10-24 MED ORDER — FOLIC ACID 5 MG/ML IJ SOLN
INTRAMUSCULAR | Status: AC
  Filled 2014-10-24: qty 0.2

## 2014-10-24 MED ORDER — WARFARIN SODIUM 5 MG PO TABS
5.0000 mg | ORAL_TABLET | Freq: Once | ORAL | Status: AC
  Administered 2014-10-24: 5 mg via ORAL
  Filled 2014-10-24: qty 1

## 2014-10-24 MED ORDER — HYDROCODONE-ACETAMINOPHEN 5-325 MG PO TABS
1.0000 | ORAL_TABLET | Freq: Four times a day (QID) | ORAL | Status: DC | PRN
Start: 2014-10-24 — End: 2014-10-25
  Administered 2014-10-24 (×2): 1 via ORAL
  Filled 2014-10-24 (×2): qty 1

## 2014-10-24 MED ORDER — WARFARIN - PHARMACIST DOSING INPATIENT: Freq: Every day | Status: DC

## 2014-10-24 MED ORDER — THIAMINE HCL 100 MG/ML IJ SOLN
INTRAMUSCULAR | Status: AC
  Filled 2014-10-24: qty 2

## 2014-10-24 NOTE — Care Management Note (Signed)
Case Management Note  Patient Details  Name: [name] Walker MRN: [id number] Date of Birth: 1955-10-02  Subjective/Objective:                    Action/Plan:   Expected Discharge Date:  10/27/14               Expected Discharge Plan:  Homeless Shelter  In-House Referral:  Clinical Social Work  Discharge planning Services  CM Consult  Post Acute Care Choice:  NA Choice offered to:  NA  DME Arranged:    DME Agency:     HH Arranged:    HH Agency:     Status of Service:  Completed, signed off  Medicare Important Message Given:    Date Medicare IM Given:    Medicare IM give by:    Date Additional Medicare IM Given:    Additional Medicare Important Message give by:     If discussed at Long Length of Stay Meetings, dates discussed:    Additional Comments: Pt to be discharged home today. Pt notified that medications would be free with his insurance. Pt does not know if he is going to return to his home in [place] or stay around [place] for a little longer. Pt encouraged to follow up with his PCP in [place] with [facility]. [name] Queen [place], RN 10/24/2014, 3:07 PM

## 2014-10-24 NOTE — Progress Notes (Signed)
[facility] PROGRESS NOTE  [name] Walker ZOX:[id number] DOB: [birth year] DOA: 10/23/2014 PCP: PROVIDER NOT IN SYSTEM  Summary  59 year old male with history of CAD, s/p prior MI and CVA, bilateral pulmonary emboli and DVT and DM who presented to the ED for a fall secondary to weakness. He reported to have left shoulder pain secondary to fall. In the hospital, his labs revealed hypokalemia and hypocalcemia possibly from poor oral intake that was repleted with IVF and potassium. Patient was placed on CIWA protocol for history of alcohol abuse. He has been transitioned to Xarelto due to easier compliance for anticoagulation. He's had repeated episodes of hypoglycemia today. If blood sugars remain stable, anticipate discharge in a.m.  Assessment/Plan: 1. Hypokalemia. Repleted with IVF. Potassium stable at 4.5 today. Likely related to decreased by mouth intake 2. Hypocalcemia. Repleted with IVF. Calcium at 8.6. Likely related to decreased by mouth intake  3. DM, type 2. Patient has been having episodes of hypoglycemia today. He reports that he did not take any medications for diabetes for over a year. He was restarted on twice a day glipizide as well as basal insulin and metformin. This may be in fact too much medication for the patient. Will discontinue glipizide and basal insulin at this time and continue metformin. Monitor his blood sugars overnight and if stable he can likely discharge on only metformin tomorrow. 4. Left shoulder pain secondary to fall. Treat supportively 5. HTN. Continue Lopressor and losartan.  6. History of bilateral PE and DVT. Patient was taking Coumadin prior to admission but has had many issues with noncompliance and appropriate follow-up. Patient is unable to get regular lab work to ensure therapeutic levels of Coumadin. He may be better suited for a novel oral anticoagulant. We'll transition him to Xarelto. 7. HLD. Continue statin. 8. Tobacco abuse. Counseled on  importance of smoking cessation. 9. History of alcohol abuse. No evidence of withdrawal at this time. Patient is on CIWA protocol. He was advised to abstain from further alcohol use.  Code Status: Full DVT Prophylaxis: Xarelto Family discussion: Discussed plan in detail. No further concerns at this time. Disposition Plan: Anticipate discharge within 24 hours.   Consultants:    Procedures:    Antibiotics:    HPI/Subjective: Patient is feeling better today. He still has some pain in the right shoulder after his fall, but overall is feeling better. He has had several episodes of hypoglycemia today.  Objective: Filed Vitals:   10/24/14 0531  BP: 145/96  Pulse: 83  Temp: 98.1 F (36.7 C)  Resp: 16    Intake/Output Summary (Last 24 hours) at 10/24/14 0743 Last data filed at 10/24/14 0500  Gross per 24 hour  Intake      0 ml  Output    800 ml  Net   -800 ml   Filed Weights   10/23/14 1316 10/24/14 0000  Weight: 113.399 kg (250 lb) 114.01 kg (251 lb 5.5 oz)    Exam:  General:  Appears calm and comfortable Cardiovascular: RRR, no m/r/g. No LE edema. Respiratory: CTA bilaterally, no w/r/r. Normal respiratory effort. Abdomen: soft, ntnd Skin: no rash or induration seen on limited exam Musculoskeletal: grossly normal tone BUE/BLE Psychiatric: grossly normal mood and affect, speech fluent and appropriate Neurologic: grossly non-focal.   Data Reviewed: Basic Metabolic Panel:  Recent Labs Lab 10/23/14 1558 10/24/14 0034 10/24/14 0504  NA 138 132* 138  K 2.5* 3.5 4.1  CL 112* 99* 106  CO2 21* 27 27  GLUCOSE 109* 159* 132*  BUN CREATININE 0.76 1.20 1.19  CALCIUM 6.2* 8.3* 8.6*  MG  --  1.9  --   PHOS  --  3.3  --    Liver Function Tests:  Recent Labs Lab 10/23/14 1558  AST 23  ALT 14*  ALKPHOS 61  BILITOT 0.5  PROT 5.6*  ALBUMIN 2.8*  CBC:  Recent Labs Lab 10/23/14 1558  WBC 5.8  NEUTROABS 3.2  HGB 14.9  HCT 42.8  MCV 87.7  PLT  246   Cardiac Enzymes:  Recent Labs Lab 10/23/14 1558  TROPONINI <0.03   BNP (last 3 results)  Recent Labs  10/23/14 1558  BNP 12.0    CBG:  Recent Labs Lab 10/24/14 0004  GLUCAP 106*     Studies: Dg Chest 2 View  10/23/2014   CLINICAL DATA:  Lower leg swelling  EXAM: CHEST  2 VIEW  COMPARISON:  09/11/2011  FINDINGS: Normal heart size. Focal eventration of the left hemidiaphragm is stable. Clear lungs. No pleural effusion and no pneumothorax  IMPRESSION: No active cardiopulmonary disease.   Electronically Signed   By: Jolaine Click M.D.   On: 10/23/2014 16:00   Dg Shoulder Left  10/23/2014   CLINICAL DATA:  Patient fell on left side with left shoulder pain, initial encounter  EXAM: LEFT SHOULDER - 2+ VIEW  COMPARISON:  None.  FINDINGS: Mild glenohumeral arthritis.  No fracture or dislocation.  IMPRESSION: No acute findings.   Electronically Signed   By: Esperanza Heir M.D.   On: 10/23/2014 13:44    Scheduled Meds: . clopidogrel  75 mg Oral Daily  . enoxaparin (LOVENOX) injection  40 mg Subcutaneous Q24H  . folic acid  1 mg Oral Daily  . glipiZIDE  10 mg Oral BID AC  . insulin aspart  0-15 Units Subcutaneous TID WC  . insulin aspart  0-5 Units Subcutaneous QHS  . insulin detemir  10 Units Subcutaneous QHS  . losartan  25 mg Oral Daily  . metFORMIN  1,000 mg Oral BID WC  . metoprolol tartrate  25 mg Oral BID  . multivitamin with minerals  1 tablet Oral Daily  . potassium chloride  40 mEq Oral Daily  . rosuvastatin  20 mg Oral Daily  . sodium chloride  3 mL Intravenous Q12H  . thiamine  100 mg Oral Daily  . Warfarin - Pharmacist Dosing Inpatient   Does not apply q1800   Continuous Infusions:   Active Problems:   Hypokalemia   Hypocalcemia   HTN (hypertension)   DM (diabetes mellitus) (HCC)   Tobacco abuse   Alcohol abuse   Left shoulder pain   Fall   Weakness generalized    Time spent: 25 minutes  Erick Blinks, M.D.  [facility] Pager  [phone]. If 7PM-7AM, please contact night-coverage at [email or web address], password [facility] 10/24/2014, 7:43 AM

## 2014-10-24 NOTE — Progress Notes (Signed)
ANTICOAGULATION CONSULT NOTE - Preliminary  Pharmacy Consult for warfarin Indication: VTE prophylaxis  No Known Allergies  Patient Measurements: Height:  (185.4 cm) Weight: 250 lb (113.399 kg) IBW/kg (Calculated) : 79.9 HEPARIN DW (KG): 103.9   Vital Signs: Temp: 97.7 F (36.5 C) (10/10 1737) Temp Source: Oral (10/10 1737) BP: 132/85 mmHg (10/10 2300) Pulse Rate: 87 (10/10 2300)  Labs:  Recent Labs  10/23/14 1558  HGB 14.9  HCT 42.8  PLT 246  LABPROT 14.0  INR 1.06  CREATININE 0.76  TROPONINI <0.03   Estimated Creatinine Clearance: 128 mL/min (by C-G formula based on Cr of 0.76).  Medical History: Past Medical History  Diagnosis Date  . Hypertension   . Diabetes mellitus   . Anxiety   . Arthritis   . MI (myocardial infarction) (HCC)   . Stroke (cerebrum) (HCC)     Medications:  Scheduled:  . clopidogrel  75 mg Oral Daily  . enoxaparin (LOVENOX) injection  40 mg Subcutaneous Q24H  . folic acid  1 mg Oral Daily  . glipiZIDE  10 mg Oral BID AC  . insulin aspart  0-15 Units Subcutaneous TID WC  . insulin aspart  0-5 Units Subcutaneous QHS  . insulin detemir  10 Units Subcutaneous QHS  . losartan  25 mg Oral Daily  . metFORMIN  1,000 mg Oral BID WC  . metoprolol tartrate  25 mg Oral BID  . multivitamin with minerals  1 tablet Oral Daily  . potassium chloride  10 mEq Intravenous Q1 Hr x 3  . potassium chloride  40 mEq Oral Daily  . rosuvastatin  20 mg Oral Daily  . banana bag IV 1000 mL   Intravenous Once  . sodium chloride  3 mL Intravenous Q12H  . thiamine  100 mg Oral Daily  . warfarin  5 mg Oral Once  . Warfarin - Pharmacist Dosing Inpatient   Does not apply q1800    Assessment: 59 yo male with hx DVT and PE on Coumadin 2.5 mg daily except 5 mg on Sundays. INR 1.06. Pt reports not taking Coumadin in past couple of weeks due to travel.   Goal of Therapy:  INR 2-3   Plan:  Warfarin 5 mg x 1 dose tonight, f/u by [facility] for tomorrow's  dose. INR scheduled for daily.  Preliminary review of pertinent patient information completed.  Jeani Hawking clinical pharmacist will complete review during morning rounds to assess the patient and finalize treatment regimen.  Kveon Casanas, Berneice Heinrich, RPH 10/24/2014,12:27 AM

## 2014-10-24 NOTE — Clinical Social Work Note (Signed)
Clinical Social Work Assessment  Patient Details  Name: [name] Walker MRN: [id number] Date of Birth: Feb 07, 1955  Date of referral:  10/24/14               Reason for consult:  Housing Concerns/Homelessness                Permission sought to share information with:   [employer]) Permission granted to share information::  Yes, Verbal Permission Granted  Name::     [name] Walker [phone]  Agency::  [employer]   Relationship::  [name] Walker at [employer] Information:     Housing/Transportation Living arrangements for the past 2 months:  Actor of Information:  Patient Patient Interpreter Needed:  None Criminal Activity/Legal Involvement Pertinent to Current Situation/Hospitalization:  No - Comment as needed Significant Relationships:  Siblings Lives with:  Self Do you feel safe going back to the place where you live?  Yes Need for family participation in patient care:  Yes (Comment)  Care giving concerns: None identified    Social Worker assessment / plan:  Patient advised that he came to [facility] from [place] in September to check on his little brother who had a stroke.  He stated that he had difficulty locating his brother.  He reported that he has an apartment in [place]. Through the [employer]. He stated that he owes them nearly $500.00 for the months of Sept. And Oct. He stated that he stayed three weeks in [facility].  Patient advised that he did not know where he would stay upon discharge. CSW spoke with Doristine Walker at the [employer] [phone].  She advised that patient had left the area and had not informed them of his whereabouts.  She advised that patient could return to his home as he had not been evicted. She advised that patient would have to pay his September and October rents in their entirety upon getting his next check on 11/3.  She advised that if patient did not have  transportation from the hospital upon discharge, he could call her and she would send a case manager to pick him up. Patient was agreeable to this plan. Patient advised that he uses a four prong walker in the home.  He stated that he left the walker at his home upon coming to [facility] as he left with someone who had a small car and could not fit his walker.    Employment status:    Insurance information:  Medicare, [employer] PT Recommendations:  Not assessed at this time Information / Referral to community resources:  Shelter  Patient/Family's Response to care:  Patient is agreeable to return to his apartment in [place], [place]. Upon discharge.  He reports that he would like to stay at the hospital a little longer.   Patient/Family's Understanding of and Emotional Response to Diagnosis, Current Treatment, and Prognosis:  Patient understands his diagnosis, treatment and prognosis.   Emotional Assessment Appearance:  Appears stated age Attitude/Demeanor/Rapport:   (Cooperative) Affect (typically observed):  Calm Orientation:  Oriented to Self, Oriented to Place, Oriented to  Time, Oriented to Situation Alcohol / Substance use:  Tobacco Use, Alcohol Use Psych involvement (Current and /or in the community):  No (Comment)  Discharge Needs  Concerns to be addressed:  Discharge Planning Concerns Readmission within the last 30 days:  No Current discharge risk:  Homeless Barriers to Discharge:  No Barriers Identified  Annice Needy, LCSW 10/24/2014, 12:05 PM [phone]

## 2014-10-24 NOTE — Care Management Note (Signed)
Case Management Note  Patient Details  Name: [name] Walker MRN: [id number] Date of Birth: 11/22/1955  Subjective/Objective:                  Pt admitted for hypokalemia. Pt has been staying with friends but states that at discharge he will be homeless as he cannot go stay with friends again. Pt lost his home in [place] last month and needs $500 to get his place back. Pt has a PCP at [facility] in [place] once he returns back to [place] but he does not know when that will be. Pt is asking about homeless shelters in the area.  Action/Plan: CSW is aware that pt needs information on homeless shelters. Will try to establish some follow up in the area until pt can return to [place].  Expected Discharge Date:  10/27/14               Expected Discharge Plan:  Homeless Shelter  In-House Referral:  Clinical Social Work  Discharge planning Services  CM Consult  Post Acute Care Choice:  NA Choice offered to:  NA  DME Arranged:    DME Agency:     HH Arranged:    HH Agency:     Status of Service:  In process, will continue to follow  Medicare Important Message Given:    Date Medicare IM Given:    Medicare IM give by:    Date Additional Medicare IM Given:    Additional Medicare Important Message give by:     If discussed at Long Length of Stay Meetings, dates discussed:    Additional Comments:  Cheryl Flash, RN 10/24/2014, 10:48 AM

## 2014-10-24 NOTE — Progress Notes (Signed)
ANTICOAGULATION CONSULT NOTE   Pharmacy Consult for warfarin and lovenox Indication: VTE Treatment  No Known Allergies  Patient Measurements: Height:  (185.4 cm) Weight: 251 lb 5.5 oz (114.01 kg) IBW/kg (Calculated) : 79.9 HEPARIN DW (KG): 104.1   Vital Signs: Temp: 98.1 F (36.7 C) (10/11 0531) Temp Source: Oral (10/11 0531) BP: 145/96 mmHg (10/11 0531) Pulse Rate: 85 (10/11 0800)  Labs:  Recent Labs  10/23/14 1558 10/24/14 0034 10/24/14 0504 10/24/14 0717  HGB 14.9  --   --   --   HCT 42.8  --   --   --   PLT 246  --   --   --   LABPROT 14.0  --  15.3*  --   INR 1.06  --  1.19  --   CREATININE 0.76 1.20 1.19 1.17  TROPONINI <0.03  --   --   --    Estimated Creatinine Clearance: 87.7 mL/min (by C-G formula based on Cr of 1.17).  Medical History: Past Medical History  Diagnosis Date  . Hypertension   . Diabetes mellitus   . Anxiety   . Arthritis   . MI (myocardial infarction) (HCC)   . Stroke (cerebrum) (HCC)     Medications:  Scheduled:  . clopidogrel  75 mg Oral Daily  . enoxaparin (LOVENOX) injection  40 mg Subcutaneous Q24H  . folic acid  1 mg Oral Daily  . glipiZIDE  10 mg Oral BID AC  . insulin aspart  0-15 Units Subcutaneous TID WC  . insulin aspart  0-5 Units Subcutaneous QHS  . insulin detemir  10 Units Subcutaneous QHS  . losartan  25 mg Oral Daily  . metFORMIN  1,000 mg Oral BID WC  . metoprolol tartrate  25 mg Oral BID  . multivitamin with minerals  1 tablet Oral Daily  . potassium chloride  40 mEq Oral Daily  . rosuvastatin  20 mg Oral Daily  . sodium chloride  3 mL Intravenous Q12H  . thiamine  100 mg Oral Daily  . Warfarin - Pharmacist Dosing Inpatient   Does not apply q1800    Assessment: 59 yo male with hx DVT and PE on Coumadin 2.5 mg daily except 5 mg on Sundays. INR 1.06. Pt reports not taking Coumadin in past couple of weeks due to travel. Patient present with complaints of weakness and leg swelling. He also fell outside  and injured his left shoulder(no fx).  Patient takes  on Sunday and 2.5mg  all other days. Will bridge with Lovenox 1 mg/kg sq q12hr (  per dose)  Goal of Therapy:  INR 2-3   Plan:  Warfarin 5 mg x 1 tonight. Lovenox /kg sq q12h (dose=110mg ) INR scheduled for daily.  Monitor for s/s bleeding  Elder Cyphers, BS Loura Back, BCPS Clinical Pharmacist Pager [phone]  10/24/2014,10:51 AM

## 2014-10-25 DIAGNOSIS — F1010 Alcohol abuse, uncomplicated: Secondary | ICD-10-CM

## 2014-10-25 DIAGNOSIS — E876 Hypokalemia: Secondary | ICD-10-CM | POA: Diagnosis not present

## 2014-10-25 DIAGNOSIS — E119 Type 2 diabetes mellitus without complications: Secondary | ICD-10-CM | POA: Diagnosis not present

## 2014-10-25 DIAGNOSIS — S4992XA Unspecified injury of left shoulder and upper arm, initial encounter: Secondary | ICD-10-CM | POA: Diagnosis not present

## 2014-10-25 LAB — GLUCOSE, CAPILLARY
Glucose-Capillary: 127 mg/dL — ABNORMAL HIGH (ref 65–99)
Glucose-Capillary: 129 mg/dL — ABNORMAL HIGH (ref 65–99)

## 2014-10-25 LAB — CBC
HCT: 40 % (ref 39.0–52.0)
HEMOGLOBIN: 13.7 g/dL (ref 13.0–17.0)
MCH: 30.3 pg (ref 26.0–34.0)
MCHC: 34.3 g/dL (ref 30.0–36.0)
MCV: 88.5 fL (ref 78.0–100.0)
Platelets: 238 10*3/uL (ref 150–400)
RBC: 4.52 MIL/uL (ref 4.22–5.81)
RDW: 13.4 % (ref 11.5–15.5)
WBC: 4.3 10*3/uL (ref 4.0–10.5)

## 2014-10-25 LAB — HEMOGLOBIN A1C
Hgb A1c MFr Bld: 7.5 % — ABNORMAL HIGH (ref 4.8–5.6)
Mean Plasma Glucose: 169 mg/dL

## 2014-10-25 LAB — PROTIME-INR
INR: 1.15 (ref 0.00–1.49)
Prothrombin Time: 14.9 seconds (ref 11.6–15.2)

## 2014-10-25 MED ORDER — ENOXAPARIN SODIUM 120 MG/0.8ML ~~LOC~~ SOLN
SUBCUTANEOUS | Status: AC
  Filled 2014-10-25: qty 0.8

## 2014-10-25 MED ORDER — ADULT MULTIVITAMIN W/MINERALS CH: 1.0000 | ORAL_TABLET | Freq: Every day | ORAL | Status: AC

## 2014-10-25 MED ORDER — THIAMINE HCL 100 MG PO TABS: 100.0000 mg | ORAL_TABLET | Freq: Every day | ORAL | Status: AC

## 2014-10-25 MED ORDER — FOLIC ACID 1 MG PO TABS: 1.0000 mg | ORAL_TABLET | Freq: Every day | ORAL | Status: AC

## 2014-10-25 MED ORDER — RIVAROXABAN 20 MG PO TABS: 20.0000 mg | ORAL_TABLET | Freq: Every day | ORAL | Status: AC

## 2014-10-25 NOTE — Progress Notes (Signed)
PROGRESS NOTE  [name] Walker EXB:[id number] DOB: [birth year] DOA: 10/23/2014 PCP: Melvyn Neth, NP PCP at [facility] in [place]   Summary: 59 year old male with history of CAD, s/p prior MI and CVA, bilateral pulmonary emboli and DVT and DM who presented to the ED for a fall secondary to weakness. He reported to have left shoulder pain secondary to fall. While in the ED, his labs revealed hypokalemia and hypocalcemia. He was admitted for further management. Patient was placed on CIWA protocol for history of alcohol abuse.    Assessment/Plan: 1. Hypokalemia, secondary to poor oral intake. Repleted with IVF.  2. Hypocalcemia. Resolved. Likely related to decreased PO intake  3. DM, type 2, stable. Episode of hypoglycemia resolved. CBG 127.  4. Left shoulder pain secondary to fall. Treated supportively. Xray unremarkable for acute injury.  5. HTN. Continue Lopressor and losartan.  6. History of bilateral PE and DVT. Patient was taking Coumadin prior to admission but has had many issues with noncompliance and appropriate follow-up. Patient is unable to get regular lab work to ensure therapeutic levels of Coumadin. Transitioned to Xarelto. 7. HLD. Continue home medication. 8. Tobacco use disorder. Counseled on importance of smoking cessation. 9. Alcohol abuse. No evidence of withdrawal at this time. Patient is on CIWA protocol. He was advised to abstain from further alcohol use. 10. Non compliance    Overall improved. Discharge home today.   Discussed all meds with him and changes. Recommended close followup with PCP in [place].  Code Status: Full DVT Prophylaxis: Xarelto Family Communication: No family at bedside. Discussed with patient who understands and has no concerns at this time. Disposition Plan: Discharge home today.   Brendia Sacks, MD  [facility]  Pager [phone] If 7PM-7AM, please contact night-coverage at [email or web address], password [facility] 10/25/2014, 7:42 AM     Consultants:    Procedures:    Antibiotics:    HPI/Subjective: Feels good. Denies any SOB, nausea, or vomiting.   Objective: Filed Vitals:   10/24/14 1200 10/24/14 1326 10/24/14 2129 10/25/14 0557  BP:  119/71 151/90 129/85  Pulse: 80 89 87 92  Temp:  98 F (36.7 C) 98.5 F (36.9 C) 98.9 F (37.2 C)  TempSrc:  Oral Oral Oral  Resp:  Height:      Weight:      SpO2:  94% 95% 98%    Intake/Output Summary (Last 24 hours) at 10/25/14 0742 Last data filed at 10/25/14 0600  Gross per 24 hour  Intake    840 ml  Output   2350 ml  Net  -1510 ml     Filed Weights   10/23/14 1316 10/24/14 0000  Weight: 113.399 kg (250 lb) 114.01 kg (251 lb 5.5 oz)    Exam:    VSS, afebrile, not hypoxic General:  Appears comfortable, calm. Cardiovascular: Regular rate and rhythm, no murmur, rub or gallop. No lower extremity edema. Respiratory: Clear to auscultation bilaterally, no wheezes, rales or rhonchi. Normal respiratory effort. Abdomen: soft, ntnd Musculoskeletal: grossly normal tone bilateral upper and lower extremities Psychiatric: grossly normal mood and affect, speech fluent and appropriate Neurologic: grossly non-focal.  New data reviewed:  CBC unremarkable.  CBG 127  Pertinent data since admission:  Initial Potassium 2.5  Calcium 6.2  Pending data:    Scheduled Meds: . clopidogrel  75 mg Oral Daily  . folic acid  1 mg Oral Daily  . insulin aspart  0-15 Units Subcutaneous TID WC  . insulin aspart  0-5  Units Subcutaneous QHS  . losartan  25 mg Oral Daily  . metFORMIN  1,000 mg Oral BID WC  . metoprolol tartrate  25 mg Oral BID  . multivitamin with minerals  1 tablet Oral Daily  . potassium chloride  40 mEq Oral Daily  . rivaroxaban  20 mg Oral q1800  . rosuvastatin  20 mg Oral Daily  . sodium chloride  3 mL Intravenous Q12H  . thiamine  100 mg Oral Daily   Continuous Infusions:   Active Problems:   Hypokalemia   Hypocalcemia   HTN  (hypertension)   DM (diabetes mellitus) (HCC)   Tobacco abuse   Alcohol abuse   Left shoulder pain   Fall   Weakness generalized   By signing my name below, I, Burnett HarryJennifer Gregorio attest that this documentation has been prepared under the direction and in the presence of Brendia Sacksaniel Goodrich, MD Electronically signed: Burnett HarryJennifer Gregorio, Scribe. 10/25/2014 11:20AM  I personally performed the services described in this documentation. All medical record entries made by the scribe were at my direction. I have reviewed the chart and agree that the record reflects my personal performance and is accurate and complete. Brendia Sacksaniel Goodrich, MD

## 2014-10-25 NOTE — Clinical Social Work Note (Signed)
CSW met with patient to discuss discharge planning.  Patient advised that he was going to have his nephew pick him up from the hospital and that he was going to his nephew's house until Friday.  He advised that on Friday he would go back to his apartment in [place], [place].  CSW encouraged patient to contact Dalene Seltzer at the [employer] to advise them of his plan to return on Friday.  Patient indicated that he had the number and that he would call them to notify them of his plan.   Ihor Gully, [place] [phone]

## 2014-10-25 NOTE — Discharge Summary (Signed)
Physician Discharge Summary  [name] Walker ZOX:[id number] DOB: [birth year] DOA: 10/23/2014  PCP: Melvyn Neth, NP  Admit date: 10/23/2014 Discharge date: 10/25/2014  Recommendations for Outpatient Follow-up:  1. Follow up change to anticoagulation (see below) 2. Follow DM (patient not taking any meds at home, therefore with hypoglycemia as inpatient have simplified regimen to metformin BID as he has not taken insulin in awhile. 3. Recommend abstinence from alcohol and tobacco.   Follow-up Information    Follow up with Melvyn Neth, NP. Schedule an appointment as soon as possible for a visit in 1 week.   Specialty:  Family Medicine   Contact information:   [address] [place] [address] [phone]      Discharge Diagnoses:  1. Hypokalemia. 2. Hypocalcemia. 3. DM, type 2. 4. Essential HTN.  5. History of bilateral PE and DVT.  6. Tobacco use disorder.  7. Alcohol abuse. 8. Non compliance   Discharge Condition: Improved Disposition: Home  Diet recommendation: Heart healthy  Filed Weights   10/23/14 1316 10/24/14 0000  Weight: 113.399 kg (250 lb) 114.01 kg (251 lb 5.5 oz)    History of present illness:  59 year old male with history of CAD, s/p prior MI and CVA, bilateral pulmonary emboli and DVT and DM who presented to the ED for a fall secondary to weakness. He reported to have left shoulder pain secondary to fall. While in the ED, his labs revealed hypokalemia and hypocalcemia. He was admitted for further management. Patient was placed on CIWA protocol for history of alcohol abuse.   Hospital Course:  Hypokalemia and hypocalcemia, both thought to be related to patient's poor PO intake were repleted. During admission, patient had a brief episode of hypoglycemia which was quickly corrected. His blood sugars have remained stable throughout the rest of his admission course. Left shoulder pain secondary to a fall was treated supportively. Xray revealed no acute  abnormality. Patient was transitioned to Xarelto during his admission because he has a hx of being noncompliant and is unable to get regular lab work to ensure his Coumadin levels are therapeutic. He was counseled extensively on the importance of medication compliance as well as the cessation of tobacco and alcohol use. He was placed on CIWA protocols but did not exhibit any signs of withdrawal during the course of his admission. All other issues remained stable.   1. Hypokalemia, secondary to poor oral intake. Repleted with IVF.  2. Hypocalcemia. Resolved. Likely related to decreased PO intake  3. DM, type 2, stable. Episode of hypoglycemia resolved. CBG 127. Has been taking no meds at home. 4. Left shoulder pain secondary to fall. Treated supportively. Xray unremarkable for acute injury.  5. HTN. Continue Lopressor and losartan.  6. History of bilateral PE and DVT. Patient was taking Coumadin prior to admission but has had many issues with noncompliance and appropriate follow-up. Patient is unable to get regular lab work to ensure therapeutic levels of Coumadin. Transitioned to Xarelto. 7. HLD. Continue home medication. 8. Tobacco use disorder. Counseled on importance of smoking cessation. 9. Alcohol abuse. No evidence of withdrawal at this time. Patient is on CIWA protocol. He was advised to abstain from further alcohol use. 10. Non compliance  Consultants:  none  Procedures:  none  Antibiotics:  none  Discharge Instructions Discharge Instructions    Diet - low sodium heart healthy    Complete by:  As directed      Diet Carb Modified    Complete by:  As directed  Discharge instructions    Complete by:  As directed   Call your physician or seek immediate medical attention for pain, fever, shortness of breath or worsening of condition.     Increase activity slowly    Complete by:  As directed             Discharge Medication List as of 10/25/2014 12:08 PM    START  taking these medications   Details  folic acid (FOLVITE) 1 MG tablet Take 1 tablet (1 mg total) by mouth daily., Starting 10/25/2014, Until Discontinued, No Print    Multiple Vitamin (MULTIVITAMIN WITH MINERALS) TABS tablet Take 1 tablet by mouth daily., Starting 10/25/2014, Until Discontinued, No Print    rivaroxaban (XARELTO) 20 MG TABS tablet Take 1 tablet (20 mg total) by mouth daily at 6 PM., Starting 10/25/2014, Until Discontinued, Print    thiamine 100 MG tablet Take 1 tablet (100 mg total) by mouth daily., Starting 10/25/2014, Until Discontinued, No Print      CONTINUE these medications which have NOT CHANGED   Details  clopidogrel (PLAVIX) 75 MG tablet Take 75 mg by mouth daily.  , Until Discontinued, Historical Med    cyclobenzaprine (FLEXERIL) 10 MG tablet Take 10 mg by mouth every 8 (eight) hours as needed for muscle spasms., Until Discontinued, Historical Med    losartan (COZAAR) 25 MG tablet Take 25 mg by mouth daily., Until Discontinued, Historical Med    meloxicam (MOBIC) 15 MG tablet Take 15 mg by mouth daily., Until Discontinued, Historical Med    metFORMIN (GLUCOPHAGE) 1000 MG tablet Take 1,000 mg by mouth 2 (two) times daily with a meal., Until Discontinued, Historical Med    metoprolol tartrate (LOPRESSOR) 25 MG tablet Take 25 mg by mouth 2 (two) times daily.  , Until Discontinued, Historical Med    nitroGLYCERIN (NITROSTAT) 0.4 MG SL tablet Place 0.4 mg under the tongue every 5 (five) minutes as needed.  , Until Discontinued, Historical Med    rosuvastatin (CRESTOR) 20 MG tablet Take 20 mg by mouth daily.  , Until Discontinued, Historical Med      STOP taking these medications     glipiZIDE (GLUCOTROL) 10 MG tablet      insulin detemir (LEVEMIR) 100 UNIT/ML injection      warfarin (COUMADIN) 5 MG tablet        No Known Allergies  The results of significant diagnostics from this hospitalization (including imaging, microbiology, ancillary and laboratory)  are listed below for reference.    Significant Diagnostic Studies: Dg Chest 2 View  10/23/2014  CLINICAL DATA:  Lower leg swelling EXAM: CHEST  2 VIEW COMPARISON:  09/11/2011 FINDINGS: Normal heart size. Focal eventration of the left hemidiaphragm is stable. Clear lungs. No pleural effusion and no pneumothorax IMPRESSION: No active cardiopulmonary disease. Electronically Signed   By: Jolaine Click M.D.   On: 10/23/2014 16:00   Dg Shoulder Left  10/23/2014  CLINICAL DATA:  Patient fell on left side with left shoulder pain, initial encounter EXAM: LEFT SHOULDER - 2+ VIEW COMPARISON:  None. FINDINGS: Mild glenohumeral arthritis.  No fracture or dislocation. IMPRESSION: No acute findings. Electronically Signed   By: Esperanza Heir M.D.   On: 10/23/2014 13:44      Labs: Basic Metabolic Panel:  Recent Labs Lab 10/24/14 0034 10/24/14 0504 10/24/14 0717 10/24/14 1149 10/24/14 1526 10/24/14 1946  NA 132* 138 141 137 138 137  K 3.5 4.1 4.5 4.0 4.3 3.9  CL 99* 106 106 107 109  104  CO2 27 27 27 23 24 29   GLUCOSE 159* 132* 130* 157* 62* 182*  BUN 7 7 6 7 8 8   CREATININE 1.20 1.19 1.17 1.14 1.16 1.21  CALCIUM 8.3* 8.6* 8.6* 8.9 9.3 8.9  MG 1.9  --   --   --   --   --   PHOS 3.3  --   --   --   --   --    Liver Function Tests:  Recent Labs Lab 10/23/14 1558  AST 23  ALT 14*  ALKPHOS 61  BILITOT 0.5  PROT 5.6*  ALBUMIN 2.8*    CBC:  Recent Labs Lab 10/23/14 1558 10/25/14 0539  WBC 5.8 4.3  NEUTROABS 3.2  --   HGB 14.9 13.7  HCT 42.8 40.0  MCV 87.7 88.5  PLT 246 238   Cardiac Enzymes:  Recent Labs Lab 10/23/14 1558  TROPONINI <0.03      Recent Labs  10/23/14 1558  BNP 12.0      CBG:  Recent Labs Lab 10/24/14 1636 10/24/14 1726 10/24/14 2028 10/25/14 0735 10/25/14 1124  GLUCAP 61* 119* 221* 127* 129*    Principal Problem:   Hypokalemia Active Problems:   Hypocalcemia   HTN (hypertension)   DM (diabetes mellitus) (HCC)   Tobacco abuse    Alcohol abuse   Left shoulder pain   Fall   Weakness generalized   Time coordinating discharge: 35 minutes  Signed:  Brendia Sacksaniel Goodrich, MD [facility] 10/25/2014, 11:29 AM   By signing my name below, I, Burnett HarryJennifer Gregorio attest that this documentation has been prepared under the direction and in the presence of Brendia Sacksaniel Goodrich, MD Electronically signed: Burnett HarryJennifer Gregorio, Scribe.  10/25/2014  I personally performed the services described in this documentation. All medical record entries made by the scribe were at my direction. I have reviewed the chart and agree that the record reflects my personal performance and is accurate and complete. Brendia Sacksaniel Goodrich, MD

## 2014-10-25 NOTE — Care Management Note (Signed)
Case Management Note  Patient Details  Name: Teressa SenterClifford Nygaard MRN: [id number] Date of Birth: 10/11/1955  Subjective/Objective:                    Action/Plan:   Expected Discharge Date:  10/27/14               Expected Discharge Plan:  Homeless Shelter  In-House Referral:  Clinical Social Work  Discharge planning Services  CM Consult  Post Acute Care Choice:  NA Choice offered to:  NA  DME Arranged:    DME Agency:     HH Arranged:    HH Agency:     Status of Service:  Completed, signed off  Medicare Important Message Given:    Date Medicare IM Given:    Medicare IM give by:    Date Additional Medicare IM Given:    Additional Medicare Important Message give by:     If discussed at Long Length of Stay Meetings, dates discussed:    Additional Comments: Pt discharged home today. No CM needs noted. Arlyss QueenBlackwell, Townsend Cudworth Atkinsonrowder, RN 10/25/2014, 11:45 AM

## 2014-10-25 NOTE — Progress Notes (Signed)
Pt's IV catheter removed and intact. Pt's IV site clean dry and intact. Discharge instructions medications, and follow up appointments reviewed and discussed with patient. Pt verbalized understanding and all questions were answered. No further questions at this time. Pt in stable condition and in no acute distress at time of discharge. Pt will be escorted by nurse tech.

## 2014-11-20 ENCOUNTER — Telehealth: Payer: Self-pay | Admitting: Family Medicine

## 2014-11-20 NOTE — Telephone Encounter (Signed)
Received page to call [name] in [place] [phone] today in regard to Xarelto Rx.   Per pharmacist patient never filled Rx. Paitent called today 11/7 stating he had lost Rx written 10/12 and requested refill.  I discussed with patient's PCP today by telephone, reviewed case. [name] Walker reported long history of non-compliance with warfarin and necessary PT checks.   We discussed various options. [name] Walker will contact patient to discuss anticoagulation and Rx as he feels appropriate.  I updated pharmacist by telephone.  [name] Sacksaniel Goodrich, MD

## 2017-01-04 IMAGING — DX DG SHOULDER 2+V*L*
3 series · 3 of 3 positions shown · non-contrast
Comparison: None.

CLINICAL DATA: Patient fell on left side with left shoulder pain,
initial encounter

EXAM:
LEFT SHOULDER - 2+ VIEW

[shoulder ap]
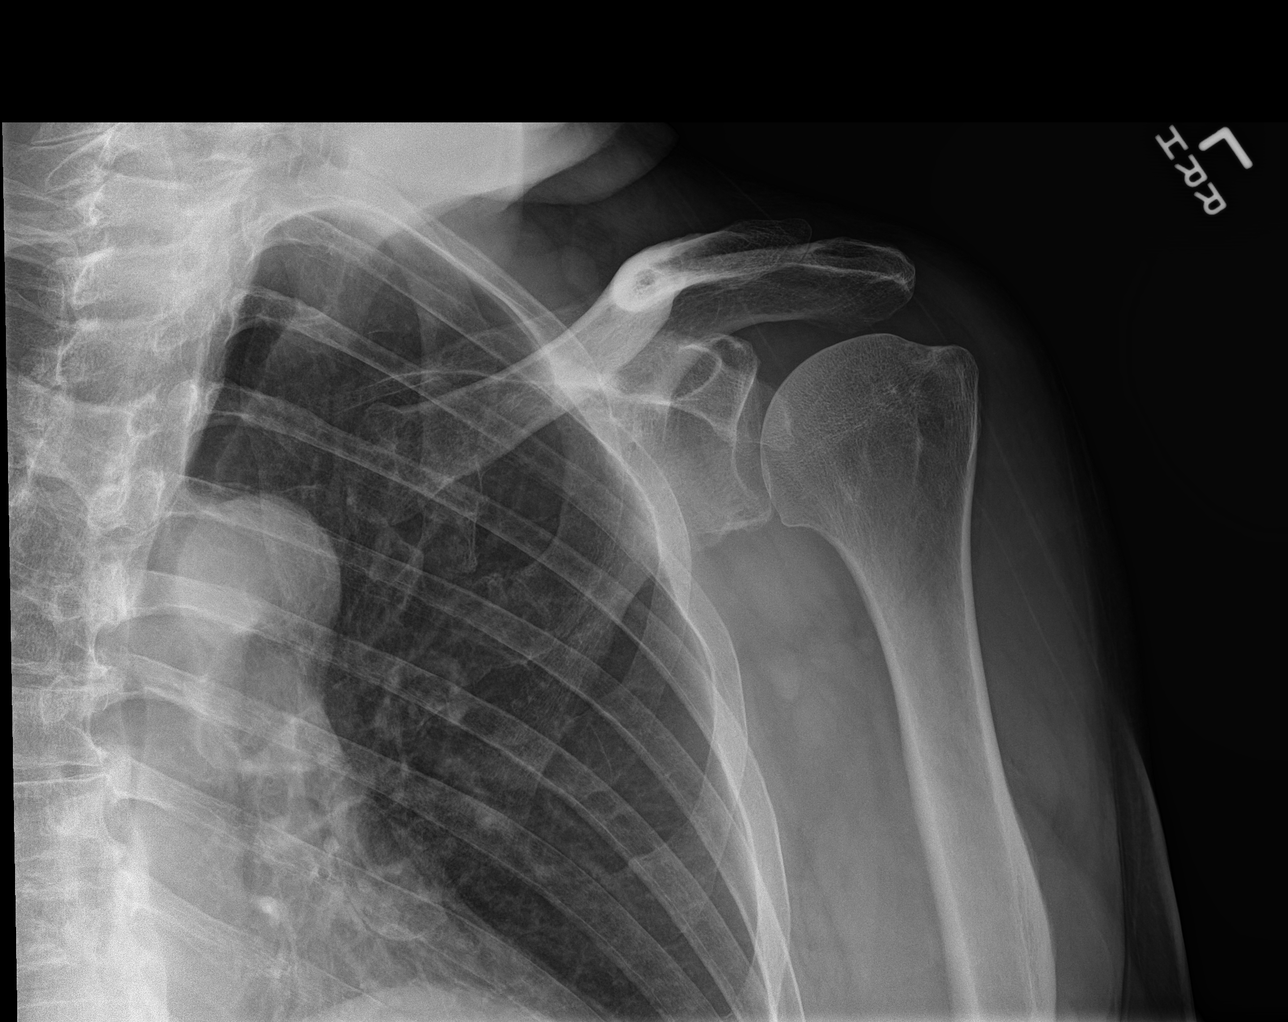

[shoulder y view]
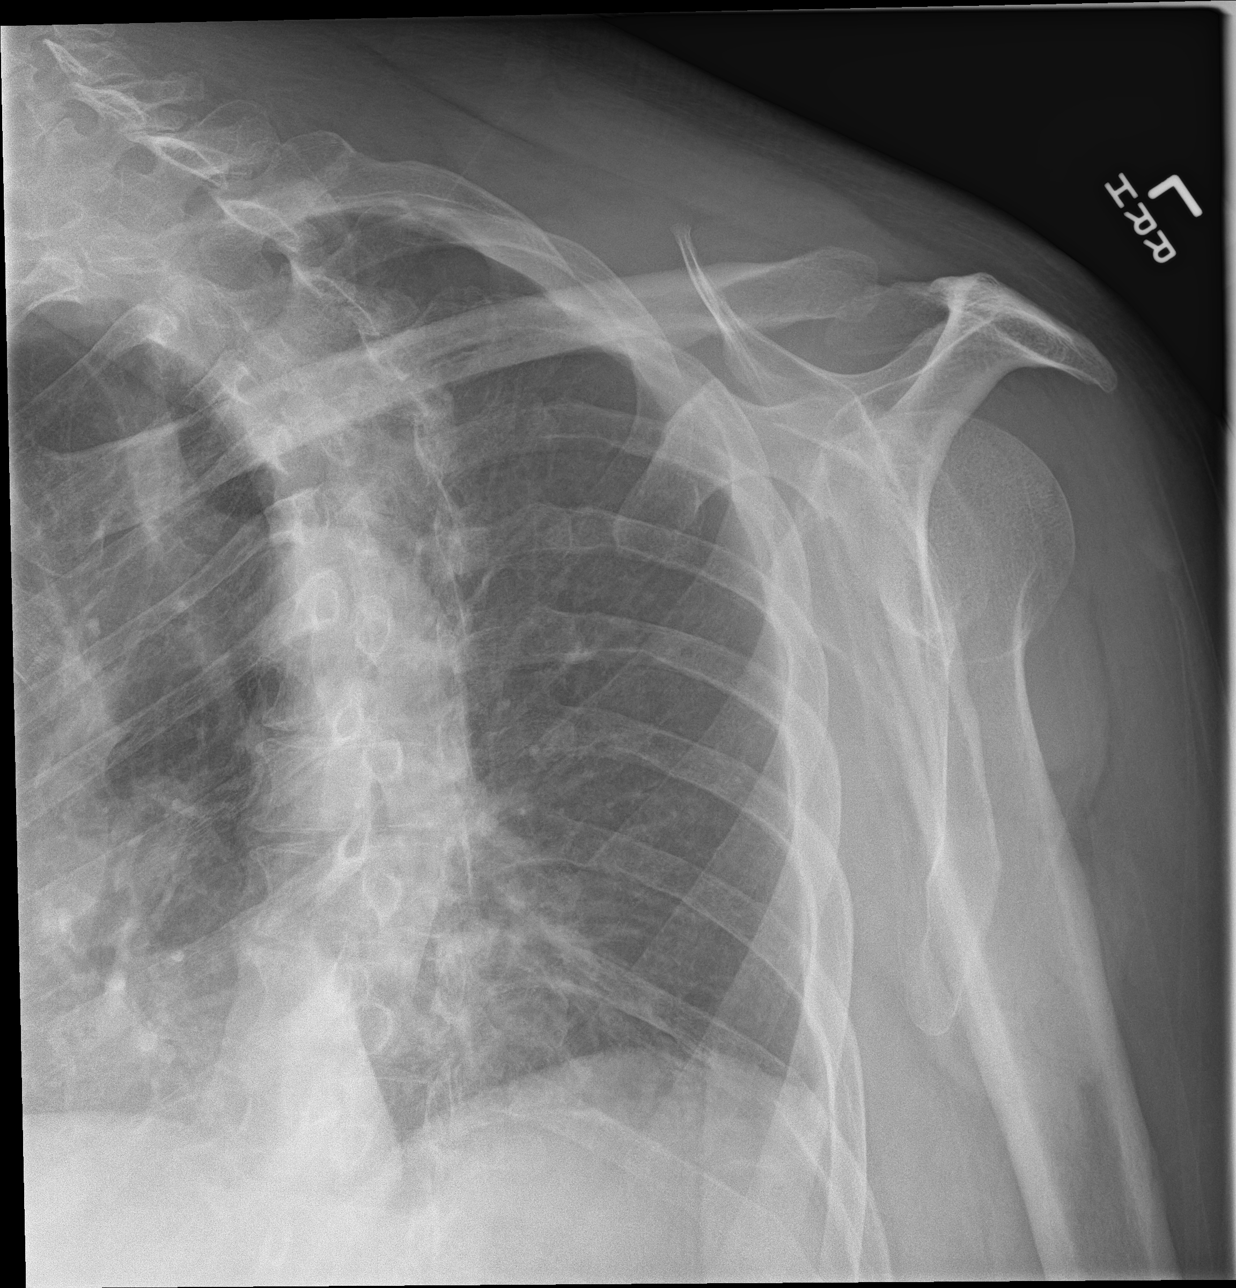

[shoulder axillary]
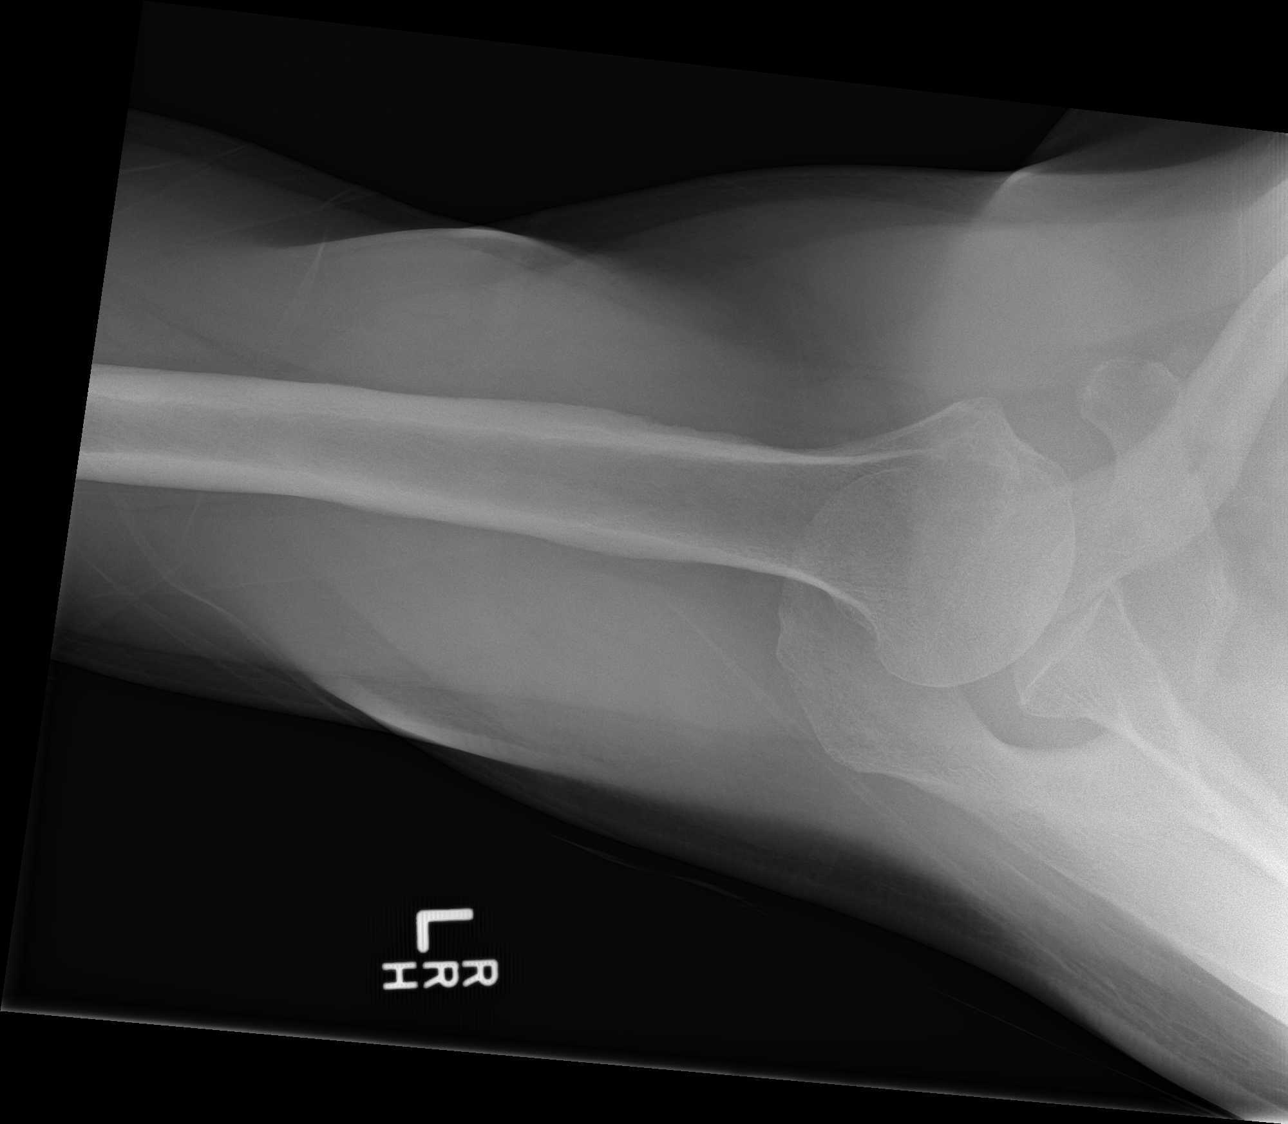

[3 of 3 positions shown; findings below may reference images not displayed]

FINDINGS: Mild glenohumeral arthritis.  No fracture or dislocation.
IMPRESSION: No acute findings.
